# Patient Record
Sex: Female | Born: 1945 | Race: Black or African American | Hispanic: No | Marital: Married | Smoking: Never smoker
Health system: Southern US, Community
[De-identification: ages and names within clinical notes are randomized; demographics above are authoritative.]

## PROBLEM LIST (undated history)

## (undated) DIAGNOSIS — R42 Dizziness and giddiness: Secondary | ICD-10-CM

## (undated) DIAGNOSIS — Z8719 Personal history of other diseases of the digestive system: Secondary | ICD-10-CM

## (undated) DIAGNOSIS — G4730 Sleep apnea, unspecified: Secondary | ICD-10-CM

## (undated) DIAGNOSIS — I10 Essential (primary) hypertension: Secondary | ICD-10-CM

## (undated) DIAGNOSIS — K219 Gastro-esophageal reflux disease without esophagitis: Secondary | ICD-10-CM

## (undated) DIAGNOSIS — G43909 Migraine, unspecified, not intractable, without status migrainosus: Secondary | ICD-10-CM

## (undated) HISTORY — PX: ABDOMINAL HYSTERECTOMY: SHX81

## (undated) HISTORY — PX: BREAST EXCISIONAL BIOPSY: SUR124

---

## 2005-07-09 ENCOUNTER — Emergency Department: Payer: Self-pay | Admitting: Emergency Medicine

## 2005-07-09 ENCOUNTER — Other Ambulatory Visit: Payer: Self-pay

## 2006-07-29 ENCOUNTER — Encounter: Admission: RE | Admit: 2006-07-29 | Discharge: 2006-07-29 | Payer: Self-pay | Admitting: Internal Medicine

## 2007-08-25 ENCOUNTER — Encounter: Admission: RE | Admit: 2007-08-25 | Discharge: 2007-08-25 | Payer: Self-pay | Admitting: Internal Medicine

## 2007-09-07 ENCOUNTER — Other Ambulatory Visit: Admission: RE | Admit: 2007-09-07 | Discharge: 2007-09-07 | Payer: Self-pay | Admitting: Internal Medicine

## 2007-09-07 ENCOUNTER — Encounter: Payer: Self-pay | Admitting: Internal Medicine

## 2007-09-07 ENCOUNTER — Ambulatory Visit: Payer: Self-pay | Admitting: Internal Medicine

## 2007-09-07 DIAGNOSIS — F3289 Other specified depressive episodes: Secondary | ICD-10-CM | POA: Insufficient documentation

## 2007-09-07 DIAGNOSIS — E785 Hyperlipidemia, unspecified: Secondary | ICD-10-CM | POA: Insufficient documentation

## 2007-09-07 DIAGNOSIS — J309 Allergic rhinitis, unspecified: Secondary | ICD-10-CM | POA: Insufficient documentation

## 2007-09-07 DIAGNOSIS — E0590 Thyrotoxicosis, unspecified without thyrotoxic crisis or storm: Secondary | ICD-10-CM | POA: Insufficient documentation

## 2007-09-07 DIAGNOSIS — F329 Major depressive disorder, single episode, unspecified: Secondary | ICD-10-CM | POA: Insufficient documentation

## 2007-09-07 DIAGNOSIS — I10 Essential (primary) hypertension: Secondary | ICD-10-CM | POA: Insufficient documentation

## 2007-09-07 LAB — CONVERTED CEMR LAB
ALT: 18 units/L (ref 0–35)
AST: 16 units/L (ref 0–37)
Albumin: 3.9 g/dL (ref 3.5–5.2)
Alkaline Phosphatase: 76 units/L (ref 39–117)
BUN: 14 mg/dL (ref 6–23)
Basophils Absolute: 0 10*3/uL (ref 0.0–0.1)
Basophils Relative: 0.6 % (ref 0.0–1.0)
Bilirubin, Direct: 0.1 mg/dL (ref 0.0–0.3)
CO2: 30 meq/L (ref 19–32)
Calcium: 9.1 mg/dL (ref 8.4–10.5)
Chloride: 103 meq/L (ref 96–112)
Cholesterol: 232 mg/dL (ref 0–200)
Creatinine, Ser: 0.8 mg/dL (ref 0.4–1.2)
Direct LDL: 155.5 mg/dL
Eosinophils Absolute: 0.6 10*3/uL (ref 0.0–0.7)
Eosinophils Relative: 9.4 % — ABNORMAL HIGH (ref 0.0–5.0)
GFR calc Af Amer: 94 mL/min
GFR calc non Af Amer: 78 mL/min
Glucose, Bld: 83 mg/dL (ref 70–99)
HCT: 40.3 % (ref 36.0–46.0)
HDL: 41 mg/dL (ref >39.0)
Hemoglobin: 13.9 g/dL (ref 12.0–15.0)
Lymphocytes Relative: 40.9 % (ref 12.0–46.0)
MCHC: 34.4 g/dL (ref 30.0–36.0)
MCV: 84.2 fL (ref 78.0–100.0)
Monocytes Absolute: 0.4 10*3/uL (ref 0.1–1.0)
Monocytes Relative: 5.4 % (ref 3.0–12.0)
Neutro Abs: 3 10*3/uL (ref 1.4–7.7)
Neutrophils Relative %: 43.7 % (ref 43.0–77.0)
Platelets: 183 10*3/uL (ref 150–400)
Potassium: 3.5 meq/L (ref 3.5–5.1)
RBC: 4.79 M/uL (ref 3.87–5.11)
RDW: 12.5 % (ref 11.5–14.6)
Sodium: 143 meq/L (ref 135–145)
TSH: 0.98 microintl units/mL (ref 0.35–5.50)
Total Bilirubin: 0.6 mg/dL (ref 0.3–1.2)
Total CHOL/HDL Ratio: 5.7
Total Protein: 7.3 g/dL (ref 6.0–8.3)
Triglycerides: 151 mg/dL — ABNORMAL HIGH (ref 0–149)
VLDL: 30 mg/dL (ref 0–40)
WBC: 6.7 10*3/uL (ref 4.5–10.5)

## 2007-11-24 ENCOUNTER — Ambulatory Visit: Payer: Self-pay | Admitting: Internal Medicine

## 2008-02-23 ENCOUNTER — Telehealth: Payer: Self-pay | Admitting: Internal Medicine

## 2008-12-01 ENCOUNTER — Encounter: Admission: RE | Admit: 2008-12-01 | Discharge: 2008-12-01 | Payer: Self-pay | Admitting: Internal Medicine

## 2009-06-08 ENCOUNTER — Telehealth: Payer: Self-pay | Admitting: Internal Medicine

## 2010-01-12 ENCOUNTER — Encounter: Admission: RE | Admit: 2010-01-12 | Discharge: 2010-01-12 | Payer: Self-pay | Admitting: Internal Medicine

## 2010-04-08 ENCOUNTER — Encounter: Payer: Self-pay | Admitting: Endocrinology

## 2010-04-08 ENCOUNTER — Encounter: Payer: Self-pay | Admitting: Internal Medicine

## 2010-04-17 NOTE — Progress Notes (Signed)
Summary: lisinopril/hctz refill  Phone Note Refill Request Message from:  Fax from Pharmacy  Refills Requested: Medication #1:  LISINOPRIL-HYDROCHLOROTHIAZIDE 20-12.5 MG  TABS 1 once daily [facility] [phone]   [phone]  Initial call taken by: Warnell Forester,  June 08, 2009 11:02 AM    Prescriptions: LISINOPRIL-HYDROCHLOROTHIAZIDE 20-12.5 MG  TABS (LISINOPRIL-HYDROCHLOROTHIAZIDE) 1 once daily  #30 x 1   Entered by:   Duard Brady LPN   Authorized by:   Gordy Savers  MD   Signed by:   Duard Brady LPN on 82/95/6213   Method used:   Faxed to ...       [facility] (retail)       [address]       [place], [place]  [address]       Ph: [phone]       Fax: [phone]   RxID:   [id number]  30 day rx only - last seen 11/2007 - will need to be seen. KIK

## 2011-03-18 ENCOUNTER — Other Ambulatory Visit: Payer: Self-pay | Admitting: Internal Medicine

## 2011-03-18 DIAGNOSIS — Z1231 Encounter for screening mammogram for malignant neoplasm of breast: Secondary | ICD-10-CM

## 2011-03-21 ENCOUNTER — Ambulatory Visit
Admission: RE | Admit: 2011-03-21 | Discharge: 2011-03-21 | Disposition: A | Discharge status: Home / Self Care | Payer: BC Managed Care – PPO | Source: Ambulatory Visit | Attending: Internal Medicine | Admitting: Internal Medicine

## 2011-03-21 DIAGNOSIS — Z1231 Encounter for screening mammogram for malignant neoplasm of breast: Secondary | ICD-10-CM

## 2011-04-02 ENCOUNTER — Encounter: Payer: Self-pay | Admitting: Internal Medicine

## 2012-05-21 ENCOUNTER — Other Ambulatory Visit: Payer: Self-pay

## 2012-05-21 DIAGNOSIS — Z1231 Encounter for screening mammogram for malignant neoplasm of breast: Secondary | ICD-10-CM

## 2012-06-17 ENCOUNTER — Ambulatory Visit
Admission: RE | Admit: 2012-06-17 | Discharge: 2012-06-17 | Disposition: A | Discharge status: Home / Self Care | Payer: BC Managed Care – PPO | Source: Ambulatory Visit

## 2012-06-17 DIAGNOSIS — Z1231 Encounter for screening mammogram for malignant neoplasm of breast: Secondary | ICD-10-CM

## 2012-07-29 ENCOUNTER — Other Ambulatory Visit: Payer: Self-pay | Admitting: Family Medicine

## 2012-07-29 ENCOUNTER — Other Ambulatory Visit (HOSPITAL_COMMUNITY)
Admission: RE | Admit: 2012-07-29 | Discharge: 2012-07-29 | Disposition: A | Discharge status: Home / Self Care | Payer: BC Managed Care – PPO | Source: Ambulatory Visit | Attending: Family Medicine | Admitting: Family Medicine

## 2012-07-29 DIAGNOSIS — Z124 Encounter for screening for malignant neoplasm of cervix: Secondary | ICD-10-CM | POA: Insufficient documentation

## 2013-06-23 DIAGNOSIS — Z1211 Encounter for screening for malignant neoplasm of colon: Secondary | ICD-10-CM | POA: Diagnosis not present

## 2013-08-10 ENCOUNTER — Other Ambulatory Visit: Payer: Self-pay

## 2013-08-10 DIAGNOSIS — Z1231 Encounter for screening mammogram for malignant neoplasm of breast: Secondary | ICD-10-CM

## 2013-08-13 ENCOUNTER — Encounter (INDEPENDENT_AMBULATORY_CARE_PROVIDER_SITE_OTHER): Payer: Self-pay

## 2013-08-13 ENCOUNTER — Ambulatory Visit
Admission: RE | Admit: 2013-08-13 | Discharge: 2013-08-13 | Disposition: A | Discharge status: Home / Self Care | Payer: BC Managed Care – PPO | Source: Ambulatory Visit

## 2013-08-13 DIAGNOSIS — Z1231 Encounter for screening mammogram for malignant neoplasm of breast: Secondary | ICD-10-CM

## 2014-04-07 DIAGNOSIS — Z681 Body mass index (BMI) 19 or less, adult: Secondary | ICD-10-CM | POA: Diagnosis not present

## 2014-04-07 DIAGNOSIS — L820 Inflamed seborrheic keratosis: Secondary | ICD-10-CM | POA: Diagnosis not present

## 2014-04-07 DIAGNOSIS — L821 Other seborrheic keratosis: Secondary | ICD-10-CM | POA: Diagnosis not present

## 2014-08-01 DIAGNOSIS — K099 Cyst of oral region, unspecified: Secondary | ICD-10-CM | POA: Diagnosis not present

## 2014-08-09 ENCOUNTER — Other Ambulatory Visit: Payer: Self-pay

## 2014-08-09 DIAGNOSIS — Z1231 Encounter for screening mammogram for malignant neoplasm of breast: Secondary | ICD-10-CM

## 2014-09-05 ENCOUNTER — Ambulatory Visit
Admission: RE | Admit: 2014-09-05 | Discharge: 2014-09-05 | Disposition: A | Discharge status: Home / Self Care | Payer: Medicare Other | Source: Ambulatory Visit

## 2014-09-05 DIAGNOSIS — Z1231 Encounter for screening mammogram for malignant neoplasm of breast: Secondary | ICD-10-CM

## 2014-09-07 DIAGNOSIS — B078 Other viral warts: Secondary | ICD-10-CM | POA: Diagnosis not present

## 2014-11-07 DIAGNOSIS — E785 Hyperlipidemia, unspecified: Secondary | ICD-10-CM | POA: Diagnosis not present

## 2014-11-07 DIAGNOSIS — I10 Essential (primary) hypertension: Secondary | ICD-10-CM | POA: Diagnosis not present

## 2014-11-07 DIAGNOSIS — Z23 Encounter for immunization: Secondary | ICD-10-CM | POA: Diagnosis not present

## 2014-11-07 DIAGNOSIS — Z7189 Other specified counseling: Secondary | ICD-10-CM | POA: Diagnosis not present

## 2015-01-10 DIAGNOSIS — F411 Generalized anxiety disorder: Secondary | ICD-10-CM | POA: Diagnosis not present

## 2015-01-10 DIAGNOSIS — I10 Essential (primary) hypertension: Secondary | ICD-10-CM | POA: Diagnosis not present

## 2015-01-10 DIAGNOSIS — Z23 Encounter for immunization: Secondary | ICD-10-CM | POA: Diagnosis not present

## 2015-01-10 DIAGNOSIS — Z0000 Encounter for general adult medical examination without abnormal findings: Secondary | ICD-10-CM | POA: Diagnosis not present

## 2015-01-10 DIAGNOSIS — E785 Hyperlipidemia, unspecified: Secondary | ICD-10-CM | POA: Diagnosis not present

## 2015-02-20 DIAGNOSIS — Z780 Asymptomatic menopausal state: Secondary | ICD-10-CM | POA: Diagnosis not present

## 2015-02-20 DIAGNOSIS — M8589 Other specified disorders of bone density and structure, multiple sites: Secondary | ICD-10-CM | POA: Diagnosis not present

## 2015-07-05 DIAGNOSIS — M25562 Pain in left knee: Secondary | ICD-10-CM | POA: Diagnosis not present

## 2015-07-05 DIAGNOSIS — R350 Frequency of micturition: Secondary | ICD-10-CM | POA: Diagnosis not present

## 2015-07-05 DIAGNOSIS — I10 Essential (primary) hypertension: Secondary | ICD-10-CM | POA: Diagnosis not present

## 2015-07-05 DIAGNOSIS — M25561 Pain in right knee: Secondary | ICD-10-CM | POA: Diagnosis not present

## 2015-08-02 DIAGNOSIS — M8580 Other specified disorders of bone density and structure, unspecified site: Secondary | ICD-10-CM | POA: Diagnosis not present

## 2015-08-02 DIAGNOSIS — I10 Essential (primary) hypertension: Secondary | ICD-10-CM | POA: Diagnosis not present

## 2015-08-02 DIAGNOSIS — Z1159 Encounter for screening for other viral diseases: Secondary | ICD-10-CM | POA: Diagnosis not present

## 2015-08-02 DIAGNOSIS — E785 Hyperlipidemia, unspecified: Secondary | ICD-10-CM | POA: Diagnosis not present

## 2015-08-02 DIAGNOSIS — F411 Generalized anxiety disorder: Secondary | ICD-10-CM | POA: Diagnosis not present

## 2015-10-19 ENCOUNTER — Other Ambulatory Visit: Payer: Self-pay | Admitting: Family Medicine

## 2015-10-19 ENCOUNTER — Other Ambulatory Visit (HOSPITAL_COMMUNITY): Payer: Self-pay | Admitting: Diagnostic Radiology

## 2015-10-19 DIAGNOSIS — Z1231 Encounter for screening mammogram for malignant neoplasm of breast: Secondary | ICD-10-CM

## 2015-10-26 ENCOUNTER — Ambulatory Visit
Admission: RE | Admit: 2015-10-26 | Discharge: 2015-10-26 | Disposition: A | Discharge status: Home / Self Care | Payer: BC Managed Care – PPO | Source: Ambulatory Visit | Attending: Family Medicine | Admitting: Family Medicine

## 2015-10-26 DIAGNOSIS — Z1231 Encounter for screening mammogram for malignant neoplasm of breast: Secondary | ICD-10-CM | POA: Diagnosis not present

## 2016-01-17 DIAGNOSIS — F411 Generalized anxiety disorder: Secondary | ICD-10-CM | POA: Diagnosis not present

## 2016-01-17 DIAGNOSIS — Z0000 Encounter for general adult medical examination without abnormal findings: Secondary | ICD-10-CM | POA: Diagnosis not present

## 2016-01-17 DIAGNOSIS — Z1389 Encounter for screening for other disorder: Secondary | ICD-10-CM | POA: Diagnosis not present

## 2016-01-17 DIAGNOSIS — I10 Essential (primary) hypertension: Secondary | ICD-10-CM | POA: Diagnosis not present

## 2016-01-17 DIAGNOSIS — E785 Hyperlipidemia, unspecified: Secondary | ICD-10-CM | POA: Diagnosis not present

## 2016-01-17 DIAGNOSIS — L821 Other seborrheic keratosis: Secondary | ICD-10-CM | POA: Diagnosis not present

## 2016-01-17 DIAGNOSIS — Z23 Encounter for immunization: Secondary | ICD-10-CM | POA: Diagnosis not present

## 2016-01-17 DIAGNOSIS — M8580 Other specified disorders of bone density and structure, unspecified site: Secondary | ICD-10-CM | POA: Diagnosis not present

## 2016-12-03 ENCOUNTER — Other Ambulatory Visit: Payer: Self-pay | Admitting: Family Medicine

## 2016-12-03 DIAGNOSIS — Z1231 Encounter for screening mammogram for malignant neoplasm of breast: Secondary | ICD-10-CM

## 2016-12-05 ENCOUNTER — Ambulatory Visit
Admission: RE | Admit: 2016-12-05 | Discharge: 2016-12-05 | Disposition: A | Discharge status: Home / Self Care | Payer: Medicare Other | Source: Ambulatory Visit | Attending: Family Medicine | Admitting: Family Medicine

## 2016-12-05 DIAGNOSIS — Z1231 Encounter for screening mammogram for malignant neoplasm of breast: Secondary | ICD-10-CM

## 2017-05-15 ENCOUNTER — Other Ambulatory Visit: Payer: Self-pay | Admitting: Family Medicine

## 2017-05-15 DIAGNOSIS — R1310 Dysphagia, unspecified: Secondary | ICD-10-CM

## 2017-05-19 ENCOUNTER — Ambulatory Visit
Admission: RE | Admit: 2017-05-19 | Discharge: 2017-05-19 | Disposition: A | Discharge status: Home / Self Care | Payer: Medicare Other | Source: Ambulatory Visit | Attending: Family Medicine | Admitting: Family Medicine

## 2017-05-19 DIAGNOSIS — R1310 Dysphagia, unspecified: Secondary | ICD-10-CM

## 2017-11-11 ENCOUNTER — Other Ambulatory Visit: Payer: Self-pay | Admitting: Family Medicine

## 2017-11-11 DIAGNOSIS — Z1231 Encounter for screening mammogram for malignant neoplasm of breast: Secondary | ICD-10-CM

## 2017-12-10 ENCOUNTER — Ambulatory Visit
Admission: RE | Admit: 2017-12-10 | Discharge: 2017-12-10 | Disposition: A | Discharge status: Home / Self Care | Payer: Medicare Other | Source: Ambulatory Visit | Attending: Family Medicine | Admitting: Family Medicine

## 2017-12-10 DIAGNOSIS — Z1231 Encounter for screening mammogram for malignant neoplasm of breast: Secondary | ICD-10-CM

## 2018-01-22 ENCOUNTER — Other Ambulatory Visit: Payer: Self-pay | Admitting: Family Medicine

## 2018-01-22 DIAGNOSIS — E2839 Other primary ovarian failure: Secondary | ICD-10-CM

## 2018-01-22 DIAGNOSIS — M8580 Other specified disorders of bone density and structure, unspecified site: Secondary | ICD-10-CM

## 2018-02-06 ENCOUNTER — Ambulatory Visit
Admission: RE | Admit: 2018-02-06 | Discharge: 2018-02-06 | Disposition: A | Discharge status: Home / Self Care | Payer: Medicare Other | Source: Ambulatory Visit | Attending: Family Medicine | Admitting: Family Medicine

## 2018-02-06 DIAGNOSIS — M8580 Other specified disorders of bone density and structure, unspecified site: Secondary | ICD-10-CM

## 2018-02-06 DIAGNOSIS — E2839 Other primary ovarian failure: Secondary | ICD-10-CM

## 2018-02-09 ENCOUNTER — Other Ambulatory Visit: Payer: Medicare Other

## 2018-03-02 ENCOUNTER — Ambulatory Visit (INDEPENDENT_AMBULATORY_CARE_PROVIDER_SITE_OTHER): Payer: Medicare Other | Admitting: Urology

## 2018-03-02 ENCOUNTER — Encounter: Payer: Self-pay | Admitting: Urology

## 2018-03-02 VITALS — BP 130/76 | HR 84 | Ht 63.5 in | Wt 155.0 lb

## 2018-03-02 DIAGNOSIS — R350 Frequency of micturition: Secondary | ICD-10-CM | POA: Diagnosis not present

## 2018-03-02 LAB — URINALYSIS, COMPLETE
Bilirubin, UA: NEGATIVE
Glucose, UA: NEGATIVE
Ketones, UA: NEGATIVE
Leukocytes, UA: NEGATIVE
Nitrite, UA: NEGATIVE
Protein, UA: NEGATIVE
RBC, UA: NEGATIVE
Specific Gravity, UA: 1.02 (ref 1.005–1.030)
Urobilinogen, Ur: 0.2 mg/dL (ref 0.2–1.0)
pH, UA: 6.5 (ref 5.0–7.5)

## 2018-03-02 LAB — MICROSCOPIC EXAMINATION
Epithelial Cells (non renal): NONE SEEN /hpf (ref 0–10)
WBC, UA: NONE SEEN /hpf (ref 0–5)

## 2018-03-02 LAB — BLADDER SCAN AMB NON-IMAGING: Scan Result: 19

## 2018-03-02 MED ORDER — MIRABEGRON ER 50 MG PO TB24: 50.0000 mg | ORAL_TABLET | Freq: Every day | ORAL | 11 refills | Status: DC

## 2018-03-02 NOTE — Progress Notes (Signed)
03/02/2018 1:32 PM   Monique Thompson 06/25/1945 [id number]  Referring provider: Deatra JamesSun, Vyvyan, MD [phone] [address] [place], [place] [id number]  Chief Complaint  Patient presents with  . frequency of micturition    HPI: Was consulted to assess the patient's voiding dysfunction primarily at night.  She gets up 4 times a night.  She will void a small volume.  Will stop and start.  She may have a little bit urgency or uncomfortable feeling.  During the day she voids every 2 or 3 hours.  She rarely leaks with a full bladder with a sneeze.  Sometimes her flow was good other times not so and it may be volume dependent  She has had a hysterectomy.  She denies a history of kidney stones previous to surgery and bladder infections  Modifying factors: There are no other modifying factors  Associated signs and symptoms: There are no other associated signs and symptoms Aggravating and relieving factors: There are no other aggravating or relieving factors Severity: Moderate Duration: Persistent   PMH: History reviewed. No pertinent past medical history.  Surgical History: Past Surgical History:  Procedure Laterality Date  . BREAST EXCISIONAL BIOPSY Left   . BREAST EXCISIONAL BIOPSY Left   . BREAST EXCISIONAL BIOPSY Right     Home Medications:  Allergies as of 03/02/2018      Reactions   Latex Rash      Medication List       Accurate as of March 02, 2018  1:32 PM. Always use your most recent med list.        amLODipine 5 MG tablet Commonly known as:  NORVASC   FLUoxetine 40 MG capsule Commonly known as:  PROZAC Take 40 mg by mouth daily.   latanoprost 0.005 % ophthalmic solution Commonly known as:  XALATAN 1 drop at bedtime.   lisinopril 10 MG tablet Commonly known as:  PRINIVIL,ZESTRIL Take by mouth.       Allergies:  Allergies  Allergen Reactions  . Latex Rash    Family History: Family History  Problem Relation Age of Onset  . Breast  cancer Neg Hx     Social History:  reports that she has never smoked. She has never used smokeless tobacco. She reports current alcohol use. She reports that she does not use drugs.  ROS: UROLOGY Frequent Urination?: Yes Hard to postpone urination?: No Burning/pain with urination?: No Get up at night to urinate?: Yes Leakage of urine?: No Urine stream starts and stops?: Yes Trouble starting stream?: Yes Do you have to strain to urinate?: No Blood in urine?: No Urinary tract infection?: No Sexually transmitted disease?: No Injury to kidneys or bladder?: No Painful intercourse?: No Weak stream?: No Currently pregnant?: No Vaginal bleeding?: No Last menstrual period?: n  Gastrointestinal Nausea?: No Vomiting?: No Indigestion/heartburn?: Yes Diarrhea?: No Constipation?: No  Constitutional Fever: No Night sweats?: No Weight loss?: No Fatigue?: No  Skin Skin rash/lesions?: No Itching?: No  Eyes Blurred vision?: No Double vision?: No  Ears/Nose/Throat Sore throat?: No Sinus problems?: Yes  Hematologic/Lymphatic Swollen glands?: No Easy bruising?: Yes  Cardiovascular Leg swelling?: Yes Chest pain?: No  Respiratory Cough?: No Shortness of breath?: No  Endocrine Excessive thirst?: No  Musculoskeletal Back pain?: No Joint pain?: No  Neurological Headaches?: No Dizziness?: No  Psychologic Depression?: No Anxiety?: Yes  Physical Exam: BP 130/76 (BP Location: Left Arm, Patient Position: Sitting, Cuff Size: Normal)   Pulse 84   Ht 5' 3.5" (1.613  m)   Wt 155 lb (70.3 kg)   BMI 27.03 kg/m   Constitutional:  Alert and oriented, No acute distress. HEENT:  AT, moist mucus membranes.  Trachea midline, no masses. Cardiovascular: No clubbing, cyanosis, or edema. Respiratory: Normal respiratory effort, no increased work of breathing. GI: Abdomen is soft, nontender, nondistended, no abdominal masses GU: No CVA tenderness.  No bladder tenderness Skin:  No rashes, bruises or suspicious lesions. Lymph: No cervical or inguinal adenopathy. Neurologic: Grossly intact, no focal deficits, moving all 4 extremities. Psychiatric: Normal mood and affect.  Laboratory Data: Lab Results  Component Value Date   WBC 6.7 09/07/2007   HGB 13.9 09/07/2007   HCT 40.3 09/07/2007   MCV 84.2 09/07/2007   PLT 183 09/07/2007    Lab Results  Component Value Date   CREATININE 0.8 09/07/2007    No results found for: PSA  No results found for: TESTOSTERONE  No results found for: HGBA1C  Urinalysis No results found for: COLORURINE, APPEARANCEUR, LABSPEC, PHURINE, GLUCOSEU, HGBUR, BILIRUBINUR, KETONESUR, PROTEINUR, UROBILINOGEN, NITRITE, LEUKOCYTESUR  Pertinent Imaging:   Assessment & Plan: The patient's symptoms may be volume dependent.  She emptied well today. The more I spoke to the patient I do feel she is feeling a little bit of urgency in the suprapubic area even during the day.  She has never smoked.  I gave her Myrbetriq samples and prescription 50 mg and I will perform cystoscopy in 6 weeks.  She did have microscopic hematuria today but her urine looked like it could be infected.  This is a persistent finding after cystoscopy I would order a CT scan  1. Frequency of micturition  - Urinalysis, Complete - Bladder Scan (Post Void Residual) in office   No follow-ups on file.  Martina Sinner, MD  [facility] [address] [place], [place] [address] [phone]

## 2018-03-02 NOTE — Addendum Note (Signed)
Addended by: Honor LohGARRISON, Kennard Fildes M on: 03/02/2018 01:43 PM   Modules accepted: Orders

## 2018-03-05 LAB — CULTURE, URINE COMPREHENSIVE

## 2018-04-13 ENCOUNTER — Encounter: Payer: Self-pay | Admitting: Urology

## 2018-04-13 ENCOUNTER — Ambulatory Visit (INDEPENDENT_AMBULATORY_CARE_PROVIDER_SITE_OTHER): Payer: Medicare Other | Admitting: Urology

## 2018-04-13 VITALS — BP 136/86 | HR 80 | Ht 63.5 in | Wt 155.4 lb

## 2018-04-13 DIAGNOSIS — R351 Nocturia: Secondary | ICD-10-CM | POA: Diagnosis not present

## 2018-04-13 DIAGNOSIS — R350 Frequency of micturition: Secondary | ICD-10-CM

## 2018-04-13 LAB — URINALYSIS, COMPLETE
Bilirubin, UA: NEGATIVE
Glucose, UA: NEGATIVE
Ketones, UA: NEGATIVE
Leukocytes, UA: NEGATIVE
Nitrite, UA: NEGATIVE
RBC, UA: NEGATIVE
Specific Gravity, UA: 1.02 (ref 1.005–1.030)
Urobilinogen, Ur: 1 mg/dL (ref 0.2–1.0)
pH, UA: 7 (ref 5.0–7.5)

## 2018-04-13 LAB — MICROSCOPIC EXAMINATION: Bacteria, UA: NONE SEEN

## 2018-04-13 NOTE — Progress Notes (Signed)
04/13/2018 10:14 AM   Monique Thompson 1946-03-15 [id number]  Referring provider: Deatra James, MD [phone] [address] [place], [place] [address]  Chief Complaint  Patient presents with  . Cysto    HPI: Was consulted to assess the patient's voiding dysfunction primarily at night.  She gets up 4 times a night.  She will void a small volume.  Will stop and start.  She may have a little bit urgency or uncomfortable feeling.  During the day she voids every 2 or 3 hours.  She rarely leaks with a full bladder with a sneeze.  Sometimes her flow was good other times not so and it may be volume dependent  The patient's symptoms may be volume dependent.  She emptied well today. The more I spoke to the patient I do feel she is feeling a little bit of urgency in the suprapubic area even during the day.  She has never smoked.  I gave her Myrbetriq samples and prescription 50 mg and I will perform cystoscopy in 6 weeks.  She did have microscopic hematuria today but her urine looked like it could be infected.  This is a persistent finding after cystoscopy I would order a CT scan  Today Frequency stable.  Last urine culture negative. Nocturia stable.  Clinically not infected Cystoscopy: Patient underwent flexible cystoscopy utilizing sterile technique after consent.  Bladder mucosa and trigone were normal.  Efflux was normal.  No cystitis or carcinoma. Reasonably well supported bladder neck and no prolapse  PMH: No past medical history on file.  Surgical History: Past Surgical History:  Procedure Laterality Date  . BREAST EXCISIONAL BIOPSY Left   . BREAST EXCISIONAL BIOPSY Left   . BREAST EXCISIONAL BIOPSY Right     Home Medications:  Allergies as of 04/13/2018      Reactions   Latex Rash      Medication List       Accurate as of April 13, 2018 10:14 AM. Always use your most recent med list.        amLODipine 5 MG tablet Commonly known as:  NORVASC   FLUoxetine 40 MG  capsule Commonly known as:  PROZAC Take 40 mg by mouth daily.   latanoprost 0.005 % ophthalmic solution Commonly known as:  XALATAN 1 drop at bedtime.   lisinopril 10 MG tablet Commonly known as:  PRINIVIL,ZESTRIL Take by mouth.   mirabegron ER 50 MG Tb24 tablet Commonly known as:  MYRBETRIQ Take 1 tablet (50 mg total) by mouth daily.       Allergies:  Allergies  Allergen Reactions  . Latex Rash    Family History: Family History  Problem Relation Age of Onset  . Breast cancer Neg Hx     Social History:  reports that she has never smoked. She has never used smokeless tobacco. She reports current alcohol use. She reports that she does not use drugs.  ROS:                                        Physical Exam: There were no vitals taken for this visit.   Laboratory Data: Lab Results  Component Value Date   WBC 6.7 09/07/2007   HGB 13.9 09/07/2007   HCT 40.3 09/07/2007   MCV 84.2 09/07/2007   PLT 183 09/07/2007    Lab Results  Component Value Date   CREATININE 0.8  09/07/2007    No results found for: PSA  No results found for: TESTOSTERONE  No results found for: HGBA1C  Urinalysis    Component Value Date/Time   APPEARANCEUR Clear 03/02/2018 1326   GLUCOSEU Negative 03/02/2018 1326   BILIRUBINUR Negative 03/02/2018 1326   PROTEINUR Negative 03/02/2018 1326   NITRITE Negative 03/02/2018 1326   LEUKOCYTESUR Negative 03/02/2018 1326    Pertinent Imaging:   Assessment & Plan: Patient has nocturia of varying amounts.  Role of desmopressin discussed.  Potential co-pay issues discussed.  Pros cons risk discussed including black box warning  The patient was given low-dose desmopressin 2 weeks samples.  She will be called with blood tests results tomorrow.  Basic metabolic panel ordered.  She will see our nurse practitioner in 2 weeks.  If the medication is effective a prescription will be given and the serum sodium level will be  checked again.  If the patient is on long-term medication they should have another serum sodium in approximately 1 month after the 2-week level.  This can be a drop off.  We will call if the serum sodium levels are abnormal.  She understands that the medication may be expensive but wish to try it obviously if it is not working in 2 weeks we will not check another serum sodium  1. Frequency of micturition  - Urinalysis, Complete   No follow-ups on file.  Martina Sinner, MD  [facility] [address] [place], [place] [address] [phone]

## 2018-04-14 LAB — BASIC METABOLIC PANEL
BUN/Creatinine Ratio: 14 (ref 12–28)
BUN: 11 mg/dL (ref 8–27)
CO2: 25 mmol/L (ref 20–29)
Calcium: 9.7 mg/dL (ref 8.7–10.3)
Chloride: 101 mmol/L (ref 96–106)
Creatinine, Ser: 0.79 mg/dL (ref 0.57–1.00)
GFR calc Af Amer: 88 mL/min/{1.73_m2} (ref >59)
GFR calc non Af Amer: 76 mL/min/{1.73_m2} (ref >59)
Glucose: 82 mg/dL (ref 65–99)
Potassium: 4.3 mmol/L (ref 3.5–5.2)
Sodium: 145 mmol/L — ABNORMAL HIGH (ref 134–144)

## 2018-04-16 ENCOUNTER — Telehealth: Payer: Self-pay | Admitting: Urology

## 2018-04-16 NOTE — Telephone Encounter (Signed)
LMOM for patient to return call.

## 2018-04-16 NOTE — Telephone Encounter (Signed)
I spoke with patient and I saw the labs and gave the results. I did notice the UCX was not ordered. Her BMP is elevated by 1 point. Patient is wanting to know what she is to do about the medication. Please advise.

## 2018-04-16 NOTE — Telephone Encounter (Signed)
Pt called requesting a call back for lab results  

## 2018-04-17 NOTE — Telephone Encounter (Signed)
Okay to start the samples because her kidney function is good and serum sodium is within normal limits

## 2018-04-17 NOTE — Telephone Encounter (Signed)
Patient notified and voiced understanding. She will return in 7 days for a sodium check

## 2018-04-29 ENCOUNTER — Telehealth: Payer: Self-pay | Admitting: Urology

## 2018-04-29 ENCOUNTER — Ambulatory Visit: Payer: Medicare Other | Admitting: Urology

## 2018-04-29 NOTE — Telephone Encounter (Signed)
Please call Monique Thompson and have her reschedule her missed appointment with me today.  She had microscopic blood in her urine and will need further studies.

## 2018-05-26 ENCOUNTER — Telehealth: Payer: Self-pay | Admitting: Urology

## 2018-05-26 NOTE — Telephone Encounter (Signed)
2 no show letters have been sent to the patient, I also called pt and lm to cb to reschedule appt.

## 2018-05-26 NOTE — Telephone Encounter (Signed)
Please call Monique Thompson and have her reschedule her missed appointment with me today.  She had microscopic blood in her urine and will need further studies.

## 2018-06-22 ENCOUNTER — Encounter: Payer: Self-pay | Admitting: Urology

## 2018-06-22 NOTE — Progress Notes (Signed)
Certified letter sent 06/22/2018 

## 2018-09-20 IMAGING — RF DG ESOPHAGUS
6 series · 14 of 23 positions shown · non-contrast
Comparison: None.

CLINICAL DATA: Dysphagia

EXAM:
ESOPHOGRAM / BARIUM SWALLOW / BARIUM TABLET STUDY
TECHNIQUE: Combined double contrast and single contrast examination performed
using effervescent crystals, thick barium liquid, and thin barium
liquid. The patient was observed with fluoroscopy swallowing a 13 mm
barium sulphate tablet.
FLUOROSCOPY TIME:  Fluoroscopy Time:  1 minutes 24 seconds
Radiation Exposure Index (if provided by the fluoroscopic device):
96 mGy
Number of Acquired Spot Images: 0

[Series 1: sequence · 2 of 10 frames shown (1 of 5)]
[frame 2/10]
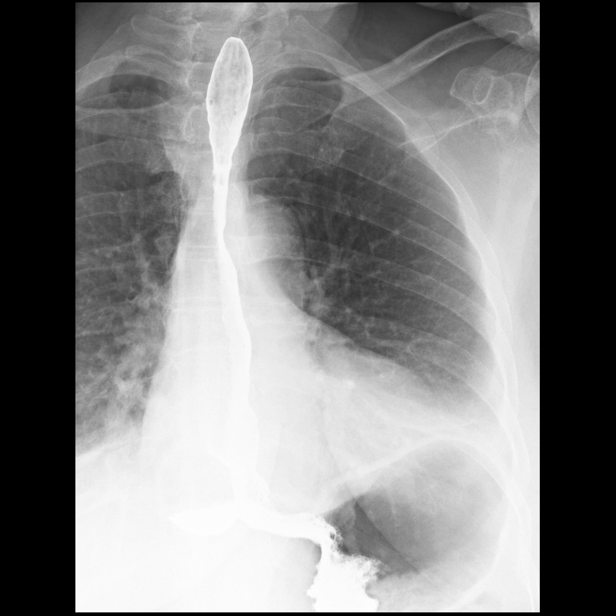
[frame 9/10]
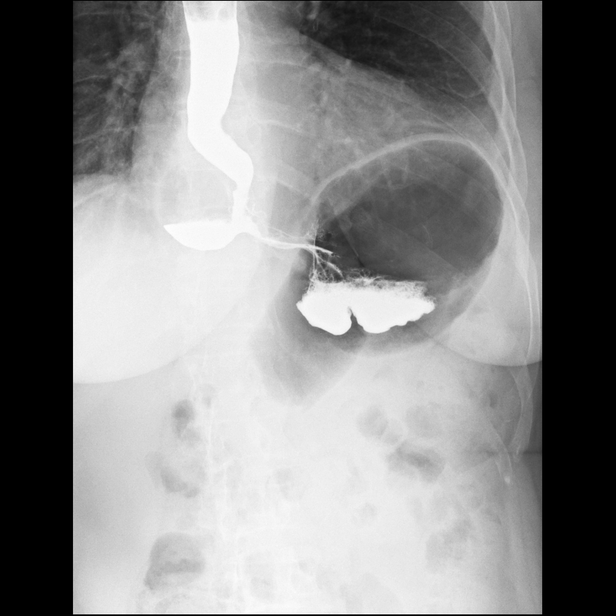

[Series 2: sequence · 2 of 3 frames shown (2 of 5)]
[frame 2/3]
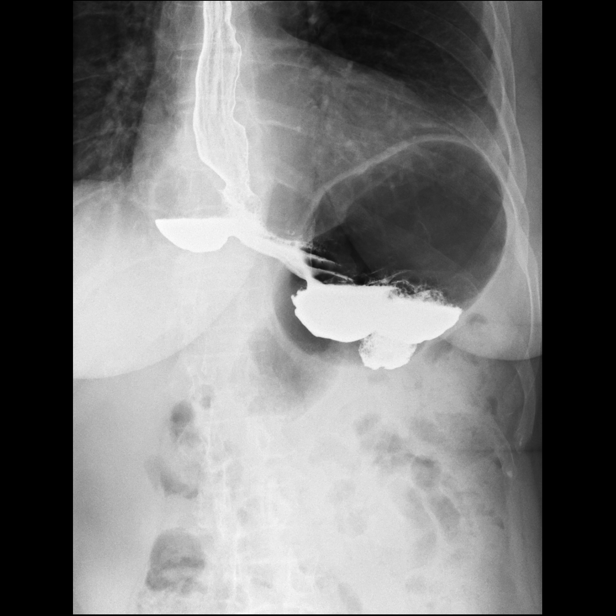
[frame 3/3]
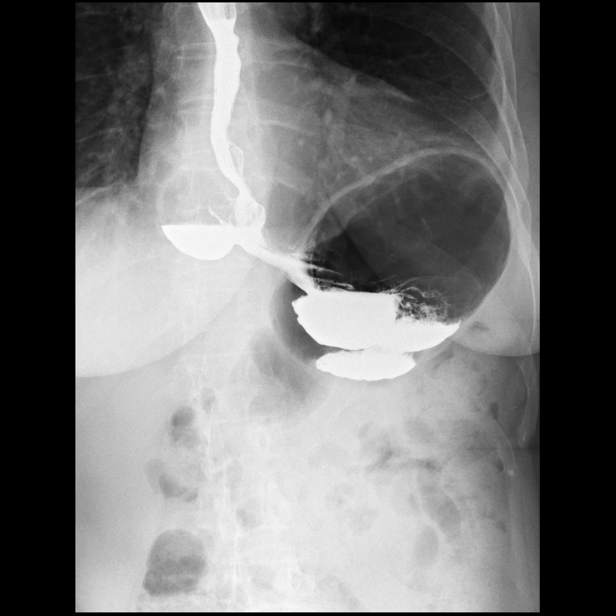

[Series 3: sequence · 1 of 23 frames shown (3 of 5)]
[frame 12/23]
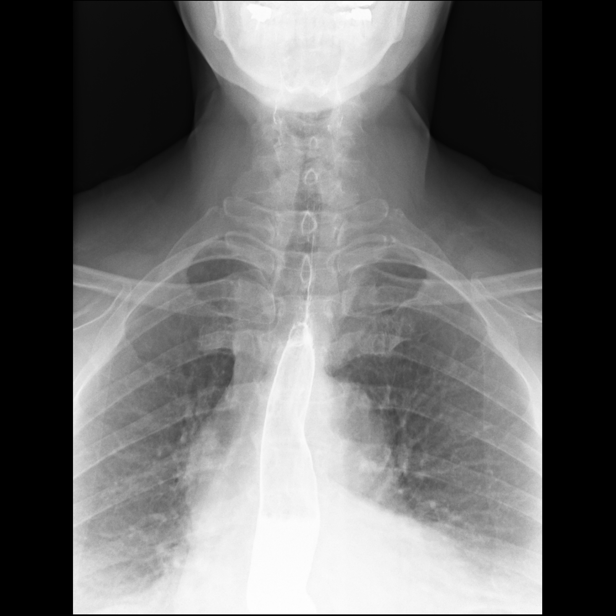

[Series 4: sequence · 3 of 17 frames shown (4 of 5)]
[frame 3/17]
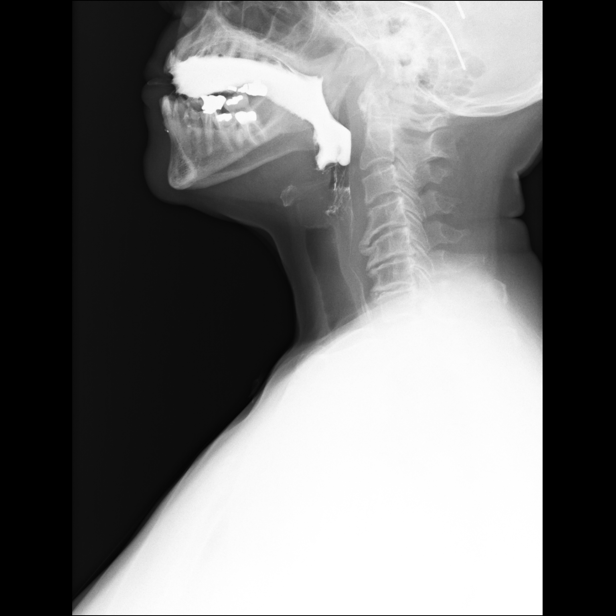
[frame 9/17]
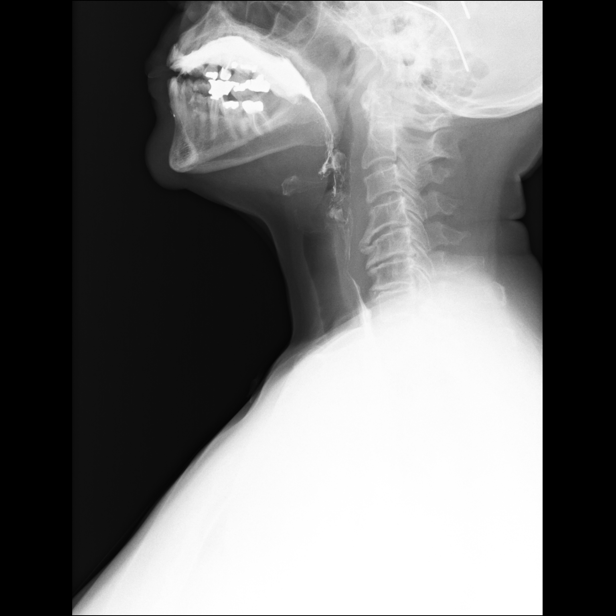
[frame 15/17]
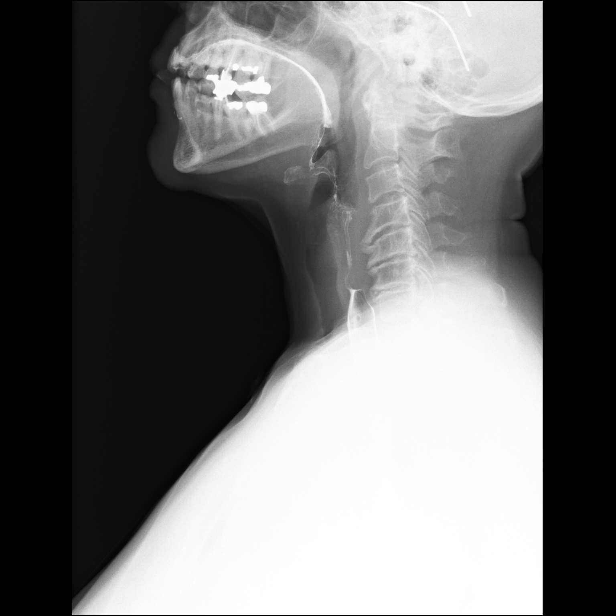

[Series 5: sequence · 2 of 32 frames shown (5 of 5)]
[frame 5/32]
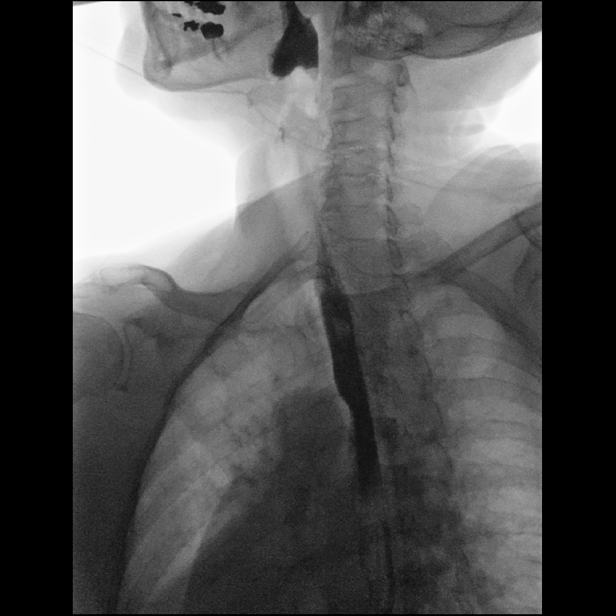
[frame 26/32]
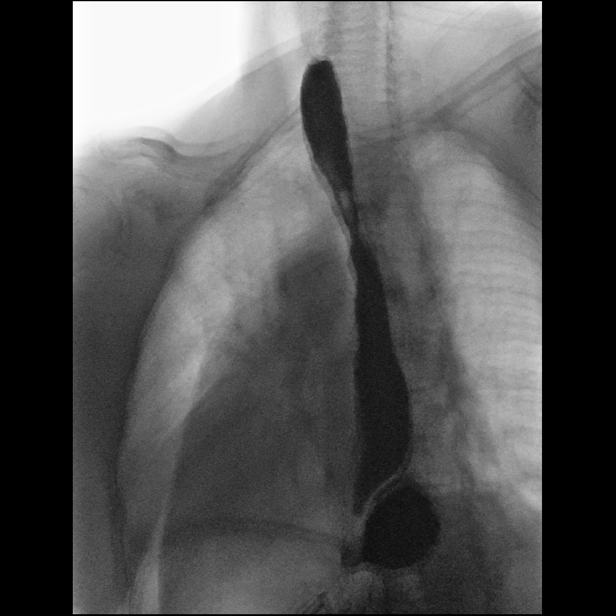

[Series 6: one shot · 4 of 6 slices shown]
[im 1/6]
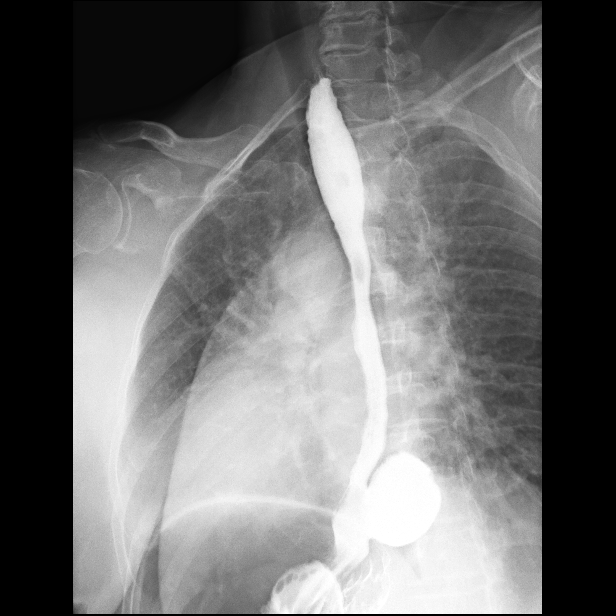
[im 2/6]
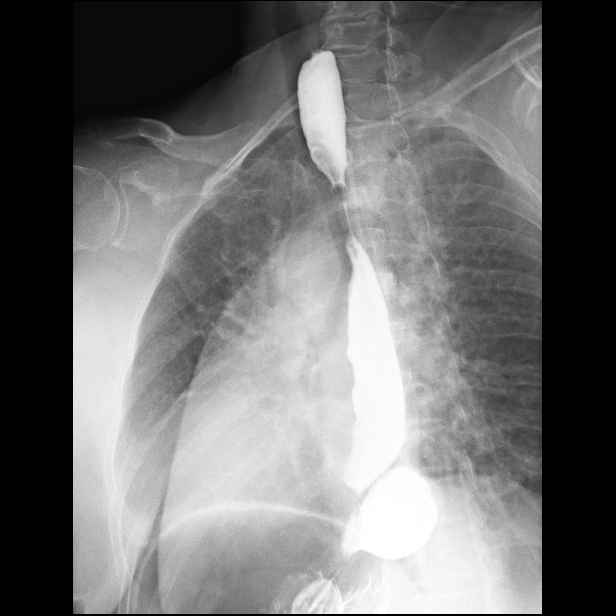
[im 4/6]
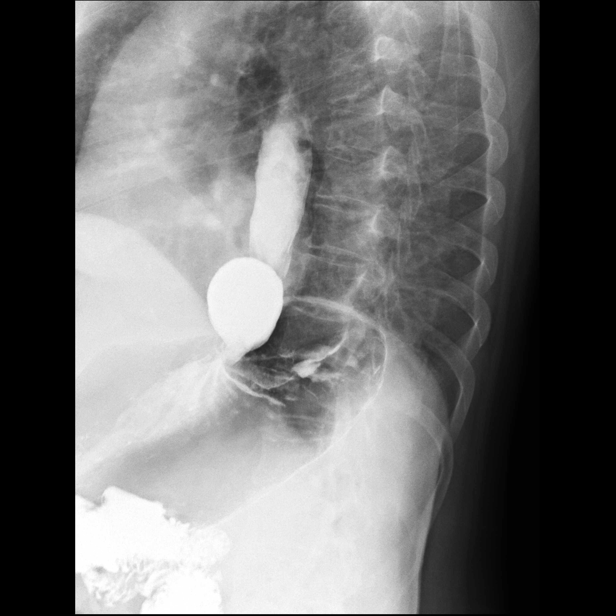
[im 6/6]
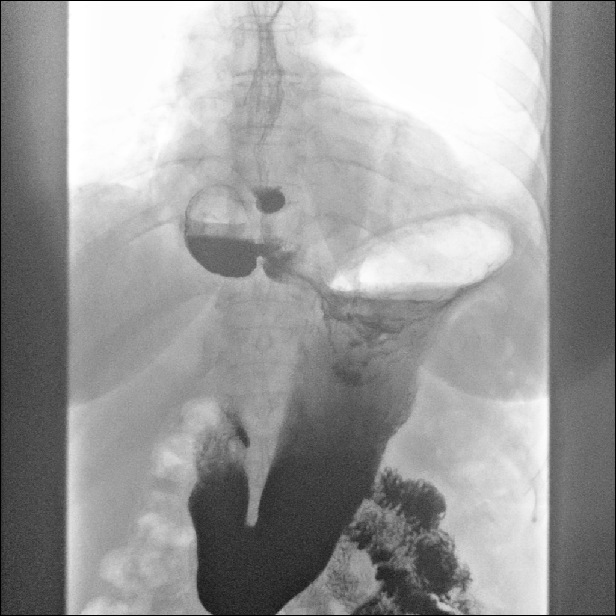

[14 of 23 positions shown; findings below may reference images not displayed]

FINDINGS: Initially double-contrast barium swallow was performed. The mucosa
of the esophagus is unremarkable. Rapid sequence spot films show the
swallowing mechanism to be normal. There is very slight indentation
upon the lower cervical esophagus primarily from the left which
could be due to prominent thyroid or thyroid nodule. Correlate
clinically. Otherwise esophageal peristalsis is normal. No
penetration or aspiration is seen. There is prominence of the
cricopharyngeus muscle noted. There is a small paraesophageal hiatal
hernia present. Moderate gastroesophageal reflux is demonstrated. A
barium pill was given at the end of the study which did lodge in the
distal esophagus consistent with a short segment distal esophageal
stricture
IMPRESSION: 1. Small paraesophageal hiatal hernia.
2. Barium pill lodges in the distal esophagus consistent with a
short segment distal esophageal stricture.
3. Moderate gastroesophageal reflux. Prominent cricopharyngeus
muscle.
4. Questionable indentation upon the lower cervical esophagus could
indicate prominence of the thyroid gland. Correlate clinically.

## 2018-11-17 ENCOUNTER — Other Ambulatory Visit: Payer: Self-pay | Admitting: Family Medicine

## 2018-11-17 DIAGNOSIS — Z1231 Encounter for screening mammogram for malignant neoplasm of breast: Secondary | ICD-10-CM

## 2018-11-18 ENCOUNTER — Other Ambulatory Visit: Payer: Self-pay | Admitting: Family Medicine

## 2018-11-18 DIAGNOSIS — N644 Mastodynia: Secondary | ICD-10-CM

## 2018-12-14 ENCOUNTER — Ambulatory Visit
Admission: RE | Admit: 2018-12-14 | Discharge: 2018-12-14 | Disposition: A | Discharge status: Home / Self Care | Payer: Medicare Other | Source: Ambulatory Visit | Attending: Family Medicine | Admitting: Family Medicine

## 2018-12-14 ENCOUNTER — Other Ambulatory Visit: Payer: Self-pay

## 2018-12-14 DIAGNOSIS — N644 Mastodynia: Secondary | ICD-10-CM

## 2019-08-11 ENCOUNTER — Other Ambulatory Visit: Payer: Self-pay | Admitting: Obstetrics and Gynecology

## 2019-08-11 DIAGNOSIS — M8580 Other specified disorders of bone density and structure, unspecified site: Secondary | ICD-10-CM

## 2019-09-29 DIAGNOSIS — H2513 Age-related nuclear cataract, bilateral: Secondary | ICD-10-CM | POA: Diagnosis not present

## 2019-11-25 DIAGNOSIS — L820 Inflamed seborrheic keratosis: Secondary | ICD-10-CM | POA: Diagnosis not present

## 2019-11-25 DIAGNOSIS — L821 Other seborrheic keratosis: Secondary | ICD-10-CM | POA: Diagnosis not present

## 2020-01-03 ENCOUNTER — Other Ambulatory Visit: Payer: Self-pay | Admitting: Family Medicine

## 2020-01-03 DIAGNOSIS — Z1231 Encounter for screening mammogram for malignant neoplasm of breast: Secondary | ICD-10-CM

## 2020-01-06 ENCOUNTER — Other Ambulatory Visit: Payer: Self-pay

## 2020-01-06 ENCOUNTER — Ambulatory Visit
Admission: RE | Admit: 2020-01-06 | Discharge: 2020-01-06 | Disposition: A | Discharge status: Home / Self Care | Payer: Medicare Other | Source: Ambulatory Visit | Attending: Family Medicine | Admitting: Family Medicine

## 2020-01-06 DIAGNOSIS — Z1231 Encounter for screening mammogram for malignant neoplasm of breast: Secondary | ICD-10-CM

## 2020-02-08 ENCOUNTER — Other Ambulatory Visit: Payer: Medicare Other

## 2020-02-14 ENCOUNTER — Other Ambulatory Visit: Payer: Self-pay | Admitting: Family Medicine

## 2020-02-14 ENCOUNTER — Ambulatory Visit
Admission: RE | Admit: 2020-02-14 | Discharge: 2020-02-14 | Disposition: A | Discharge status: Home / Self Care | Payer: Medicare PPO | Source: Ambulatory Visit | Attending: Family Medicine | Admitting: Family Medicine

## 2020-02-14 DIAGNOSIS — S62347A Nondisplaced fracture of base of fifth metacarpal bone. left hand, initial encounter for closed fracture: Secondary | ICD-10-CM | POA: Diagnosis not present

## 2020-02-14 DIAGNOSIS — E785 Hyperlipidemia, unspecified: Secondary | ICD-10-CM | POA: Diagnosis not present

## 2020-02-14 DIAGNOSIS — Z1389 Encounter for screening for other disorder: Secondary | ICD-10-CM | POA: Diagnosis not present

## 2020-02-14 DIAGNOSIS — S60222A Contusion of left hand, initial encounter: Secondary | ICD-10-CM | POA: Diagnosis not present

## 2020-02-14 DIAGNOSIS — M8580 Other specified disorders of bone density and structure, unspecified site: Secondary | ICD-10-CM | POA: Diagnosis not present

## 2020-02-14 DIAGNOSIS — S6992XD Unspecified injury of left wrist, hand and finger(s), subsequent encounter: Secondary | ICD-10-CM

## 2020-02-14 DIAGNOSIS — Z0000 Encounter for general adult medical examination without abnormal findings: Secondary | ICD-10-CM

## 2020-02-14 DIAGNOSIS — F411 Generalized anxiety disorder: Secondary | ICD-10-CM | POA: Diagnosis not present

## 2020-02-14 DIAGNOSIS — I10 Essential (primary) hypertension: Secondary | ICD-10-CM | POA: Diagnosis not present

## 2020-02-21 ENCOUNTER — Encounter: Payer: Self-pay | Admitting: Orthopedic Surgery

## 2020-02-21 ENCOUNTER — Ambulatory Visit (INDEPENDENT_AMBULATORY_CARE_PROVIDER_SITE_OTHER): Payer: Medicare PPO | Admitting: Orthopedic Surgery

## 2020-02-21 ENCOUNTER — Other Ambulatory Visit: Payer: Self-pay

## 2020-02-21 VITALS — Ht 63.0 in | Wt 150.0 lb

## 2020-02-21 DIAGNOSIS — S62347A Nondisplaced fracture of base of fifth metacarpal bone. left hand, initial encounter for closed fracture: Secondary | ICD-10-CM

## 2020-02-27 ENCOUNTER — Encounter: Payer: Self-pay | Admitting: Orthopedic Surgery

## 2020-02-27 NOTE — Progress Notes (Signed)
Office Visit Note   Patient: Monique Thompson           Date of Birth: 10/06/1945           MRN: [id number] Visit Date: 02/21/2020 Requested by: Deatra James, MD [phone] [address] [place],  [place] [address] PCP: Deatra James, MD  Subjective: Chief Complaint  Patient presents with  . Left Hand - Fracture    HPI: Monique Thompson is a 74 y.o. female who presents to the office complaining of left hand pain.  Patient stumbled and fell about 2 weeks ago and tried to brace her fall with her hand.  She saw her primary care physician last week and had x-rays taken that revealed fifth metacarpal base fracture with minimal displacement.  She notes continued swelling though her pain has continued to improve.  She is not taking any medication for pain control.  She has no history of left hand surgery.  She does not smoke.  No significant medical history aside from hypertension, hyperlipidemia, anxiety.  Denies any elbow or shoulder pain..                ROS: All systems reviewed are negative as they relate to the chief complaint within the history of present illness.  Patient denies fevers or chills.  Assessment & Plan: Visit Diagnoses:  1. Closed nondisplaced fracture of base of fifth metacarpal bone of left hand, initial encounter     Plan: Patient is a 74 year old female presents complaining of left hand pain.  She is about 2 weeks out from a fall onto her left hand where she sustained 1/5 metacarpal base fracture with minimal displacement.  Alignment is appropriate for nonoperative management.  No rotational deformity.  She does have some swelling and motion as well as tenderness at the fracture site.  With these exam findings, plan for wrist splint and stockinette for 2 weeks.  Follow-up in 3 weeks with x-rays at that time.  If radiographs look good at that time and she has improved clinically, plan for follow-up as needed after that point.  Follow-Up Instructions: No follow-ups on file.    Orders:  No orders of the defined types were placed in this encounter.  No orders of the defined types were placed in this encounter.     Procedures: No procedures performed   Clinical Data: No additional findings.  Objective: Vital Signs: Ht 5\' 3"  (1.6 m)   Wt 150 lb (68 kg)   BMI 26.57 kg/m   Physical Exam:  Constitutional: Patient appears well-developed HEENT:  Head: Normocephalic Eyes:EOM are normal Neck: Normal range of motion Cardiovascular: Normal rate Pulmonary/chest: Effort normal Neurologic: Patient is alert Skin: Skin is warm Psychiatric: Patient has normal mood and affect  Ortho Exam: Ortho exam demonstrates left hand with mild amount of swelling over the fifth metacarpal base.  There is some motion was palpable at the fracture site with mild tenderness to palpation at the fracture site.  Mild snuffbox tenderness.  No scaphoid tubercle tenderness.  No distal radius or ulna tenderness.  No laxity with DRUJ.  Wrist extension and flexion are painless.  Ulnar nerve functioning well with intact abduction of the fingers and adduction of the fingers.  No rotational deformity of the left hand or fingers.  Specialty Comments:  No specialty comments available.  Imaging: No results found.   PMFS History: Patient Active Problem List   Diagnosis Date Noted  . HYPERTHYROIDISM 09/07/2007  . HYPERLIPIDEMIA 09/07/2007  .  DEPRESSION 09/07/2007  . HYPERTENSION 09/07/2007  . ALLERGIC RHINITIS 09/07/2007   No past medical history on file.  Family History  Problem Relation Age of Onset  . Breast cancer Neg Hx     Past Surgical History:  Procedure Laterality Date  . BREAST EXCISIONAL BIOPSY Left   . BREAST EXCISIONAL BIOPSY Left   . BREAST EXCISIONAL BIOPSY Right    Social History   Occupational History  . Not on file  Tobacco Use  . Smoking status: Never Smoker  . Smokeless tobacco: Never Used  Substance and Sexual Activity  . Alcohol use: Yes  . Drug  use: Never  . Sexual activity: Not on file

## 2020-02-28 ENCOUNTER — Encounter: Payer: Self-pay | Admitting: Orthopedic Surgery

## 2020-03-20 ENCOUNTER — Ambulatory Visit: Payer: Medicare PPO | Admitting: Orthopedic Surgery

## 2020-04-04 ENCOUNTER — Telehealth: Payer: Self-pay | Admitting: *Deleted

## 2020-04-04 NOTE — Telephone Encounter (Signed)
complete

## 2020-05-16 ENCOUNTER — Other Ambulatory Visit: Payer: Self-pay | Admitting: Obstetrics and Gynecology

## 2020-05-16 DIAGNOSIS — M8580 Other specified disorders of bone density and structure, unspecified site: Secondary | ICD-10-CM

## 2020-05-17 ENCOUNTER — Other Ambulatory Visit: Payer: Medicare PPO

## 2020-08-15 DIAGNOSIS — N949 Unspecified condition associated with female genital organs and menstrual cycle: Secondary | ICD-10-CM | POA: Diagnosis not present

## 2020-08-25 DIAGNOSIS — I10 Essential (primary) hypertension: Secondary | ICD-10-CM | POA: Diagnosis not present

## 2020-08-25 DIAGNOSIS — R413 Other amnesia: Secondary | ICD-10-CM | POA: Diagnosis not present

## 2020-08-25 DIAGNOSIS — E785 Hyperlipidemia, unspecified: Secondary | ICD-10-CM | POA: Diagnosis not present

## 2020-08-25 DIAGNOSIS — F411 Generalized anxiety disorder: Secondary | ICD-10-CM | POA: Diagnosis not present

## 2020-09-26 DIAGNOSIS — R413 Other amnesia: Secondary | ICD-10-CM | POA: Diagnosis not present

## 2020-10-04 DIAGNOSIS — H40053 Ocular hypertension, bilateral: Secondary | ICD-10-CM | POA: Diagnosis not present

## 2020-10-04 DIAGNOSIS — Z0100 Encounter for examination of eyes and vision without abnormal findings: Secondary | ICD-10-CM | POA: Diagnosis not present

## 2020-10-05 DIAGNOSIS — M545 Low back pain, unspecified: Secondary | ICD-10-CM | POA: Diagnosis not present

## 2020-10-05 DIAGNOSIS — N3000 Acute cystitis without hematuria: Secondary | ICD-10-CM | POA: Diagnosis not present

## 2020-10-10 ENCOUNTER — Other Ambulatory Visit: Payer: Self-pay

## 2020-10-10 ENCOUNTER — Ambulatory Visit
Admission: RE | Admit: 2020-10-10 | Discharge: 2020-10-10 | Disposition: A | Discharge status: Home / Self Care | Payer: Medicare PPO | Source: Ambulatory Visit | Attending: Obstetrics and Gynecology | Admitting: Obstetrics and Gynecology

## 2020-10-10 DIAGNOSIS — Z780 Asymptomatic menopausal state: Secondary | ICD-10-CM | POA: Diagnosis not present

## 2020-10-10 DIAGNOSIS — M8580 Other specified disorders of bone density and structure, unspecified site: Secondary | ICD-10-CM

## 2020-10-10 DIAGNOSIS — M8589 Other specified disorders of bone density and structure, multiple sites: Secondary | ICD-10-CM | POA: Diagnosis not present

## 2020-11-17 DIAGNOSIS — F430 Acute stress reaction: Secondary | ICD-10-CM | POA: Diagnosis not present

## 2020-11-17 DIAGNOSIS — M545 Low back pain, unspecified: Secondary | ICD-10-CM | POA: Diagnosis not present

## 2020-11-28 DIAGNOSIS — H5319 Other subjective visual disturbances: Secondary | ICD-10-CM | POA: Diagnosis not present

## 2020-12-15 ENCOUNTER — Other Ambulatory Visit: Payer: Self-pay | Admitting: Family Medicine

## 2020-12-15 DIAGNOSIS — Z1231 Encounter for screening mammogram for malignant neoplasm of breast: Secondary | ICD-10-CM

## 2020-12-19 DIAGNOSIS — M5459 Other low back pain: Secondary | ICD-10-CM | POA: Diagnosis not present

## 2020-12-26 DIAGNOSIS — M5459 Other low back pain: Secondary | ICD-10-CM | POA: Diagnosis not present

## 2021-01-02 DIAGNOSIS — M5459 Other low back pain: Secondary | ICD-10-CM | POA: Diagnosis not present

## 2021-01-04 DIAGNOSIS — M5459 Other low back pain: Secondary | ICD-10-CM | POA: Diagnosis not present

## 2021-01-09 DIAGNOSIS — M5459 Other low back pain: Secondary | ICD-10-CM | POA: Diagnosis not present

## 2021-01-12 ENCOUNTER — Ambulatory Visit
Admission: RE | Admit: 2021-01-12 | Discharge: 2021-01-12 | Disposition: A | Discharge status: Home / Self Care | Payer: Medicare PPO | Source: Ambulatory Visit | Attending: Family Medicine | Admitting: Family Medicine

## 2021-01-12 ENCOUNTER — Other Ambulatory Visit: Payer: Self-pay

## 2021-01-12 DIAGNOSIS — Z1231 Encounter for screening mammogram for malignant neoplasm of breast: Secondary | ICD-10-CM

## 2021-02-01 DIAGNOSIS — M5459 Other low back pain: Secondary | ICD-10-CM | POA: Diagnosis not present

## 2021-02-13 DIAGNOSIS — M5459 Other low back pain: Secondary | ICD-10-CM | POA: Diagnosis not present

## 2021-02-15 DIAGNOSIS — M5459 Other low back pain: Secondary | ICD-10-CM | POA: Diagnosis not present

## 2021-02-26 DIAGNOSIS — M8580 Other specified disorders of bone density and structure, unspecified site: Secondary | ICD-10-CM | POA: Diagnosis not present

## 2021-02-26 DIAGNOSIS — Z1389 Encounter for screening for other disorder: Secondary | ICD-10-CM | POA: Diagnosis not present

## 2021-02-26 DIAGNOSIS — Z0000 Encounter for general adult medical examination without abnormal findings: Secondary | ICD-10-CM | POA: Diagnosis not present

## 2021-02-26 DIAGNOSIS — R413 Other amnesia: Secondary | ICD-10-CM | POA: Diagnosis not present

## 2021-02-26 DIAGNOSIS — E785 Hyperlipidemia, unspecified: Secondary | ICD-10-CM | POA: Diagnosis not present

## 2021-02-26 DIAGNOSIS — H409 Unspecified glaucoma: Secondary | ICD-10-CM | POA: Diagnosis not present

## 2021-02-26 DIAGNOSIS — F411 Generalized anxiety disorder: Secondary | ICD-10-CM | POA: Diagnosis not present

## 2021-02-26 DIAGNOSIS — I10 Essential (primary) hypertension: Secondary | ICD-10-CM | POA: Diagnosis not present

## 2021-04-23 DIAGNOSIS — H40053 Ocular hypertension, bilateral: Secondary | ICD-10-CM | POA: Diagnosis not present

## 2021-05-16 DIAGNOSIS — I10 Essential (primary) hypertension: Secondary | ICD-10-CM | POA: Diagnosis not present

## 2021-05-16 DIAGNOSIS — J069 Acute upper respiratory infection, unspecified: Secondary | ICD-10-CM | POA: Diagnosis not present

## 2021-05-17 DIAGNOSIS — F324 Major depressive disorder, single episode, in partial remission: Secondary | ICD-10-CM | POA: Diagnosis not present

## 2021-05-17 DIAGNOSIS — Z9109 Other allergy status, other than to drugs and biological substances: Secondary | ICD-10-CM | POA: Diagnosis not present

## 2021-05-17 DIAGNOSIS — I10 Essential (primary) hypertension: Secondary | ICD-10-CM | POA: Diagnosis not present

## 2021-05-17 DIAGNOSIS — M25562 Pain in left knee: Secondary | ICD-10-CM | POA: Diagnosis not present

## 2021-07-03 DIAGNOSIS — F324 Major depressive disorder, single episode, in partial remission: Secondary | ICD-10-CM | POA: Diagnosis not present

## 2021-07-03 DIAGNOSIS — I10 Essential (primary) hypertension: Secondary | ICD-10-CM | POA: Diagnosis not present

## 2021-07-03 DIAGNOSIS — F411 Generalized anxiety disorder: Secondary | ICD-10-CM | POA: Diagnosis not present

## 2021-08-29 DIAGNOSIS — E785 Hyperlipidemia, unspecified: Secondary | ICD-10-CM | POA: Diagnosis not present

## 2021-08-29 DIAGNOSIS — F411 Generalized anxiety disorder: Secondary | ICD-10-CM | POA: Diagnosis not present

## 2021-08-29 DIAGNOSIS — G3184 Mild cognitive impairment, so stated: Secondary | ICD-10-CM | POA: Diagnosis not present

## 2021-08-29 DIAGNOSIS — I10 Essential (primary) hypertension: Secondary | ICD-10-CM | POA: Diagnosis not present

## 2021-11-06 DIAGNOSIS — H2513 Age-related nuclear cataract, bilateral: Secondary | ICD-10-CM | POA: Diagnosis not present

## 2021-11-06 DIAGNOSIS — H40053 Ocular hypertension, bilateral: Secondary | ICD-10-CM | POA: Diagnosis not present

## 2021-12-24 DIAGNOSIS — A09 Infectious gastroenteritis and colitis, unspecified: Secondary | ICD-10-CM | POA: Diagnosis not present

## 2021-12-24 DIAGNOSIS — R112 Nausea with vomiting, unspecified: Secondary | ICD-10-CM | POA: Diagnosis not present

## 2021-12-25 ENCOUNTER — Other Ambulatory Visit: Payer: Self-pay | Admitting: Family Medicine

## 2021-12-25 DIAGNOSIS — Z1231 Encounter for screening mammogram for malignant neoplasm of breast: Secondary | ICD-10-CM

## 2022-01-25 ENCOUNTER — Ambulatory Visit
Admission: RE | Admit: 2022-01-25 | Discharge: 2022-01-25 | Disposition: A | Discharge status: Home / Self Care | Payer: Medicare PPO | Source: Ambulatory Visit | Attending: Family Medicine | Admitting: Family Medicine

## 2022-01-25 DIAGNOSIS — Z1231 Encounter for screening mammogram for malignant neoplasm of breast: Secondary | ICD-10-CM | POA: Diagnosis not present

## 2022-03-18 ENCOUNTER — Emergency Department
Admission: EM | Admit: 2022-03-18 | Discharge: 2022-03-18 | Disposition: A | Discharge status: Home / Self Care | Payer: Medicare PPO | Attending: Emergency Medicine | Admitting: Emergency Medicine

## 2022-03-18 ENCOUNTER — Emergency Department: Payer: Medicare PPO

## 2022-03-18 ENCOUNTER — Other Ambulatory Visit: Payer: Self-pay

## 2022-03-18 ENCOUNTER — Encounter: Payer: Self-pay | Admitting: Emergency Medicine

## 2022-03-18 DIAGNOSIS — Z1152 Encounter for screening for COVID-19: Secondary | ICD-10-CM | POA: Diagnosis not present

## 2022-03-18 DIAGNOSIS — R058 Other specified cough: Secondary | ICD-10-CM | POA: Insufficient documentation

## 2022-03-18 DIAGNOSIS — J40 Bronchitis, not specified as acute or chronic: Secondary | ICD-10-CM | POA: Diagnosis not present

## 2022-03-18 DIAGNOSIS — R059 Cough, unspecified: Secondary | ICD-10-CM | POA: Diagnosis present

## 2022-03-18 LAB — CBC WITH DIFFERENTIAL/PLATELET
Abs Immature Granulocytes: 0.01 10*3/uL (ref 0.00–0.07)
Basophils Absolute: 0.1 10*3/uL (ref 0.0–0.1)
Basophils Relative: 1 %
Eosinophils Absolute: 0.3 10*3/uL (ref 0.0–0.5)
Eosinophils Relative: 4 %
HCT: 42.5 % (ref 36.0–46.0)
Hemoglobin: 13.6 g/dL (ref 12.0–15.0)
Immature Granulocytes: 0 %
Lymphocytes Relative: 20 %
Lymphs Abs: 1.5 10*3/uL (ref 0.7–4.0)
MCH: 27.9 pg (ref 26.0–34.0)
MCHC: 32 g/dL (ref 30.0–36.0)
MCV: 87.3 fL (ref 80.0–100.0)
Monocytes Absolute: 0.6 10*3/uL (ref 0.1–1.0)
Monocytes Relative: 8 %
Neutro Abs: 5.2 10*3/uL (ref 1.7–7.7)
Neutrophils Relative %: 67 %
Platelets: 215 10*3/uL (ref 150–400)
RBC: 4.87 MIL/uL (ref 3.87–5.11)
RDW: 13.4 % (ref 11.5–15.5)
WBC: 7.7 10*3/uL (ref 4.0–10.5)
nRBC: 0 % (ref 0.0–0.2)

## 2022-03-18 LAB — COMPREHENSIVE METABOLIC PANEL
ALT: 18 U/L (ref 0–44)
AST: 24 U/L (ref 15–41)
Albumin: 4 g/dL (ref 3.5–5.0)
Alkaline Phosphatase: 95 U/L (ref 38–126)
Anion gap: 8 (ref 5–15)
BUN: 11 mg/dL (ref 8–23)
CO2: 31 mmol/L (ref 22–32)
Calcium: 9 mg/dL (ref 8.9–10.3)
Chloride: 104 mmol/L (ref 98–111)
Creatinine, Ser: 0.8 mg/dL (ref 0.44–1.00)
GFR, Estimated: >60 mL/min (ref >60)
Glucose, Bld: 105 mg/dL — ABNORMAL HIGH (ref 70–99)
Potassium: 3.9 mmol/L (ref 3.5–5.1)
Sodium: 143 mmol/L (ref 135–145)
Total Bilirubin: 1.2 mg/dL (ref 0.3–1.2)
Total Protein: 7.3 g/dL (ref 6.5–8.1)

## 2022-03-18 LAB — RESP PANEL BY RT-PCR (RSV, FLU A&B, COVID)  RVPGX2
Influenza A by PCR: NEGATIVE
Influenza B by PCR: NEGATIVE
Resp Syncytial Virus by PCR: NEGATIVE
SARS Coronavirus 2 by RT PCR: NEGATIVE

## 2022-03-18 LAB — GROUP A STREP BY PCR: Group A Strep by PCR: NOT DETECTED

## 2022-03-18 LAB — TROPONIN I (HIGH SENSITIVITY): Troponin I (High Sensitivity): 6 ng/L (ref <18)

## 2022-03-18 LAB — LACTIC ACID, PLASMA: Lactic Acid, Venous: 1 mmol/L (ref 0.5–1.9)

## 2022-03-18 MED ORDER — ALBUTEROL SULFATE HFA 108 (90 BASE) MCG/ACT IN AERS: 2.0000 | INHALATION_SPRAY | RESPIRATORY_TRACT | 0 refills | Status: AC | PRN

## 2022-03-18 MED ORDER — IBUPROFEN 600 MG PO TABS: 600.0000 mg | ORAL_TABLET | Freq: Four times a day (QID) | ORAL | 0 refills | Status: AC | PRN

## 2022-03-18 MED ORDER — ALBUTEROL SULFATE (2.5 MG/3ML) 0.083% IN NEBU: 2.5000 mg | INHALATION_SOLUTION | RESPIRATORY_TRACT | Status: DC | PRN

## 2022-03-18 MED ORDER — HYDROCODONE BIT-HOMATROP MBR 5-1.5 MG/5ML PO SOLN: 5.0000 mL | Freq: Four times a day (QID) | ORAL | 0 refills | Status: AC | PRN

## 2022-03-18 MED ORDER — DOXYCYCLINE HYCLATE 100 MG PO CAPS: 100.0000 mg | ORAL_CAPSULE | Freq: Two times a day (BID) | ORAL | 0 refills | Status: AC

## 2022-03-18 MED ORDER — PREDNISONE 20 MG PO TABS: 40.0000 mg | ORAL_TABLET | Freq: Every day | ORAL | 0 refills | Status: AC

## 2022-03-18 MED ORDER — PREDNISONE 20 MG PO TABS
40.0000 mg | ORAL_TABLET | Freq: Once | ORAL | Status: AC
  Administered 2022-03-18: 40 mg via ORAL
  Filled 2022-03-18: qty 2

## 2022-03-18 NOTE — Discharge Instructions (Addendum)
Drink plenty of fluids  Take the antibiotic with food, and the prednisone with food in the mornings.  Take the ibuprofen as needed for fever or chills or body aches.  Take the Hycodan for cough, especially at night. This may make you drowsy so use caution.

## 2022-03-18 NOTE — ED Provider Notes (Signed)
[facility] Provider Note    Event Date/Time   First MD Initiated Contact with Patient 03/18/22 [phone]     (approximate)   History   Cough, Sore Throat, and Chest Pain   HPI  Monique Thompson is a 77 y.o. female with no major past medical history here with several days of cough, runny nose, sore throat.  Patient states that her symptoms started as a sore throat approximately 4 days ago.  Since then, she has had persistent sore throat along with dry cough occasionally productive of yellow-green sputum.  She said chills and bodyaches.  She said decreased appetite.  She took over-the-counter Tylenol without significant relief.  She states that she has had persistent sore throat and cough since then and does not feel like she has been improving.  Denies history of previous lung issues or recurrent pneumonias.  She was around a family member that had influenza over Christmas.  No other complaints.     Physical Exam   Triage Vital Signs: ED Triage Vitals  Enc Vitals Group     BP 03/18/22 0633 (!) 144/78     Pulse Rate 03/18/22 0633 89     Resp 03/18/22 0633 20     Temp 03/18/22 0633 99.3 F (37.4 C)     Temp Source 03/18/22 0633 Oral     SpO2 03/18/22 0633 95 %     Weight 03/18/22 0639 145 lb (65.8 kg)     Height 03/18/22 0639 5\' 3"  (1.6 m)     Head Circumference --      Peak Flow --      Pain Score 03/18/22 0638 6     Pain Loc --      Pain Edu? --      Excl. in [place]? --     Most recent vital signs: Vitals:   03/18/22 0633  BP: (!) 144/78  Pulse: 89  Resp: 20  Temp: 99.3 F (37.4 C)  SpO2: 95%     General: Awake, no distress.  CV:  Good peripheral perfusion.  Regular rate and rhythm. Resp:  Normal effort.  Lungs clear to auscultation.  Occasional coarse, barking type cough. Abd:  No distention.  No tenderness. Other:  Posterior pharyngeal erythema.  Moist mucous membranes.  No tonsillar swelling or exudates.   ED Results / Procedures /  Treatments   Labs (all labs ordered are listed, but only abnormal results are displayed) Labs Reviewed  COMPREHENSIVE METABOLIC PANEL - Abnormal; Notable for the following components:      Result Value   Glucose, Bld 105 (*)    All other components within normal limits  RESP PANEL BY RT-PCR (RSV, FLU A&B, COVID)  [id number]  GROUP A STREP BY PCR  LACTIC ACID, PLASMA  CBC WITH DIFFERENTIAL/PLATELET  LACTIC ACID, PLASMA  URINALYSIS, ROUTINE W REFLEX MICROSCOPIC  TROPONIN I (HIGH SENSITIVITY)  TROPONIN I (HIGH SENSITIVITY)     EKG Normal sinus rhythm, ventricular rate 87.  PR 176, QRS 80, QTc 469.  No acute ST elevations or depressions.  No acute evidence of acute ischemia or infarct.   RADIOLOGY Chest x-ray: Chronic [employer] hernia, no acute normality   I also independently reviewed and agree with radiologist interpretations.   PROCEDURES:  Critical Care performed: No    MEDICATIONS ORDERED IN ED: Medications  albuterol (PROVENTIL) (2.5 MG/3ML) 0.083% nebulizer solution 2.5 mg (has no administration in time range)  predniSONE (DELTASONE) tablet 40 mg (40 mg Oral Given  03/18/22 2947)     IMPRESSION / MDM / ASSESSMENT AND PLAN / ED COURSE  I reviewed the triage vital signs and the nursing notes.                              Differential diagnosis includes, but is not limited to, URI, [facility] pneumonia, influenza, bronchitis, pharyngitis, CHF, less likely ACS.  Patient's presentation is most consistent with acute presentation with potential threat to life or bodily function.  Overall fairly healthy 77 year old female here with several days of cough, sore throat, and generalized fatigue.  Clinically, suspect URI with possible development of early community-acquired pneumonia based on her symptoms.  She does have coarse, barking type cough, raising question of croup.  She has no evidence of hypoxia or significant increased work of breathing.  Patient given a breathing  treatment to see if this would improve her subjective shortness of breath and will start on steroids given possible component of croup.  Will place on empiric antibiotics.  Patient chest x-ray is clear.  Screening lab work is very reassuring.  CBC shows no leukocytosis or anemia.  CMP unremarkable with normal renal function.  COVID and influenza are negative.  Lactic acid is normal.  Troponin negative, EKG nonischemic, do not suspect ACS.  Will plan to treat patient as outpatient, encourage hydration, with good return precautions.  FINAL CLINICAL IMPRESSION(S) / ED DIAGNOSES   Final diagnoses:  Bronchitis     Rx / DC Orders   ED Discharge Orders          Ordered    predniSONE (DELTASONE) 20 MG tablet  Daily        03/18/22 0928    doxycycline (VIBRAMYCIN) 100 MG capsule  2 times daily        03/18/22 0928    HYDROcodone bit-homatropine (HYCODAN) 5-1.5 MG/5ML syrup  Every 6 hours PRN        03/18/22 0928    ibuprofen (ADVIL) 600 MG tablet  Every 6 hours PRN        03/18/22 0928    albuterol (VENTOLIN HFA) 108 (90 Base) MCG/ACT inhaler  Every 4 hours PRN        03/18/22 0929             Note:  This document was prepared using Dragon voice recognition software and may include unintentional dictation errors.   Duffy Bruce, MD 03/18/22 [phone]

## 2022-03-18 NOTE — ED Triage Notes (Addendum)
Pt arrived via POV with reports of sore throat, cough, chills, runny nose since Thursday. Pt states she has been around young family members that have had the flu recently.  Pt also endorsing central chest pain.

## 2022-08-19 DIAGNOSIS — R197 Diarrhea, unspecified: Secondary | ICD-10-CM | POA: Diagnosis not present

## 2022-08-19 DIAGNOSIS — R1310 Dysphagia, unspecified: Secondary | ICD-10-CM | POA: Diagnosis not present

## 2022-08-19 DIAGNOSIS — Z1211 Encounter for screening for malignant neoplasm of colon: Secondary | ICD-10-CM | POA: Diagnosis not present

## 2022-08-30 DIAGNOSIS — G3184 Mild cognitive impairment, so stated: Secondary | ICD-10-CM | POA: Diagnosis not present

## 2022-08-30 DIAGNOSIS — F411 Generalized anxiety disorder: Secondary | ICD-10-CM | POA: Diagnosis not present

## 2022-08-30 DIAGNOSIS — E785 Hyperlipidemia, unspecified: Secondary | ICD-10-CM | POA: Diagnosis not present

## 2022-08-30 DIAGNOSIS — I10 Essential (primary) hypertension: Secondary | ICD-10-CM | POA: Diagnosis not present

## 2022-09-08 DIAGNOSIS — G4733 Obstructive sleep apnea (adult) (pediatric): Secondary | ICD-10-CM | POA: Diagnosis not present

## 2022-09-24 DIAGNOSIS — Q396 Congenital diverticulum of esophagus: Secondary | ICD-10-CM | POA: Diagnosis not present

## 2022-09-24 DIAGNOSIS — R1310 Dysphagia, unspecified: Secondary | ICD-10-CM | POA: Diagnosis not present

## 2022-09-24 DIAGNOSIS — K2930 Chronic superficial gastritis without bleeding: Secondary | ICD-10-CM | POA: Diagnosis not present

## 2022-09-26 DIAGNOSIS — K2930 Chronic superficial gastritis without bleeding: Secondary | ICD-10-CM | POA: Diagnosis not present

## 2022-10-09 DIAGNOSIS — G4733 Obstructive sleep apnea (adult) (pediatric): Secondary | ICD-10-CM | POA: Diagnosis not present

## 2022-11-09 DIAGNOSIS — G4733 Obstructive sleep apnea (adult) (pediatric): Secondary | ICD-10-CM | POA: Diagnosis not present

## 2022-12-10 DIAGNOSIS — G4733 Obstructive sleep apnea (adult) (pediatric): Secondary | ICD-10-CM | POA: Diagnosis not present

## 2022-12-18 DIAGNOSIS — R42 Dizziness and giddiness: Secondary | ICD-10-CM | POA: Diagnosis not present

## 2022-12-18 DIAGNOSIS — D1039 Benign neoplasm of other parts of mouth: Secondary | ICD-10-CM | POA: Diagnosis not present

## 2023-01-06 DIAGNOSIS — G4733 Obstructive sleep apnea (adult) (pediatric): Secondary | ICD-10-CM | POA: Diagnosis not present

## 2023-01-08 DIAGNOSIS — G4733 Obstructive sleep apnea (adult) (pediatric): Secondary | ICD-10-CM | POA: Diagnosis not present

## 2023-01-09 DIAGNOSIS — G4733 Obstructive sleep apnea (adult) (pediatric): Secondary | ICD-10-CM | POA: Diagnosis not present

## 2023-01-14 ENCOUNTER — Other Ambulatory Visit: Payer: Self-pay | Admitting: Family Medicine

## 2023-01-14 DIAGNOSIS — Z1231 Encounter for screening mammogram for malignant neoplasm of breast: Secondary | ICD-10-CM

## 2023-02-09 DIAGNOSIS — G4733 Obstructive sleep apnea (adult) (pediatric): Secondary | ICD-10-CM | POA: Diagnosis not present

## 2023-02-19 ENCOUNTER — Ambulatory Visit
Admission: RE | Admit: 2023-02-19 | Discharge: 2023-02-19 | Disposition: A | Discharge status: Home / Self Care | Payer: Medicare PPO | Source: Ambulatory Visit | Attending: Family Medicine | Admitting: Family Medicine

## 2023-02-19 DIAGNOSIS — Z1231 Encounter for screening mammogram for malignant neoplasm of breast: Secondary | ICD-10-CM | POA: Diagnosis not present

## 2023-02-21 DIAGNOSIS — G4733 Obstructive sleep apnea (adult) (pediatric): Secondary | ICD-10-CM | POA: Diagnosis not present

## 2023-03-11 DIAGNOSIS — G4733 Obstructive sleep apnea (adult) (pediatric): Secondary | ICD-10-CM | POA: Diagnosis not present

## 2023-03-28 DIAGNOSIS — H16223 Keratoconjunctivitis sicca, not specified as Sjogren's, bilateral: Secondary | ICD-10-CM | POA: Diagnosis not present

## 2023-03-28 DIAGNOSIS — H2513 Age-related nuclear cataract, bilateral: Secondary | ICD-10-CM | POA: Diagnosis not present

## 2023-03-28 DIAGNOSIS — H43813 Vitreous degeneration, bilateral: Secondary | ICD-10-CM | POA: Diagnosis not present

## 2023-03-28 DIAGNOSIS — H40053 Ocular hypertension, bilateral: Secondary | ICD-10-CM | POA: Diagnosis not present

## 2023-04-04 DIAGNOSIS — H409 Unspecified glaucoma: Secondary | ICD-10-CM | POA: Diagnosis not present

## 2023-04-04 DIAGNOSIS — I10 Essential (primary) hypertension: Secondary | ICD-10-CM | POA: Diagnosis not present

## 2023-04-04 DIAGNOSIS — Z0000 Encounter for general adult medical examination without abnormal findings: Secondary | ICD-10-CM | POA: Diagnosis not present

## 2023-04-04 DIAGNOSIS — F411 Generalized anxiety disorder: Secondary | ICD-10-CM | POA: Diagnosis not present

## 2023-04-04 DIAGNOSIS — F324 Major depressive disorder, single episode, in partial remission: Secondary | ICD-10-CM | POA: Diagnosis not present

## 2023-04-04 DIAGNOSIS — M8580 Other specified disorders of bone density and structure, unspecified site: Secondary | ICD-10-CM | POA: Diagnosis not present

## 2023-04-04 DIAGNOSIS — E785 Hyperlipidemia, unspecified: Secondary | ICD-10-CM | POA: Diagnosis not present

## 2023-04-04 DIAGNOSIS — H269 Unspecified cataract: Secondary | ICD-10-CM | POA: Diagnosis not present

## 2023-04-04 DIAGNOSIS — G3184 Mild cognitive impairment, so stated: Secondary | ICD-10-CM | POA: Diagnosis not present

## 2023-04-11 DIAGNOSIS — G4733 Obstructive sleep apnea (adult) (pediatric): Secondary | ICD-10-CM | POA: Diagnosis not present

## 2023-04-14 DIAGNOSIS — H2512 Age-related nuclear cataract, left eye: Secondary | ICD-10-CM | POA: Diagnosis not present

## 2023-05-12 DIAGNOSIS — G4733 Obstructive sleep apnea (adult) (pediatric): Secondary | ICD-10-CM | POA: Diagnosis not present

## 2023-06-02 ENCOUNTER — Ambulatory Visit: Payer: Medicare PPO | Admitting: Dermatology

## 2023-06-09 DIAGNOSIS — G4733 Obstructive sleep apnea (adult) (pediatric): Secondary | ICD-10-CM | POA: Diagnosis not present

## 2023-06-11 ENCOUNTER — Encounter: Payer: Self-pay | Admitting: Ophthalmology

## 2023-06-11 ENCOUNTER — Ambulatory Visit: Payer: Medicare PPO | Admitting: Dermatology

## 2023-06-11 ENCOUNTER — Encounter: Payer: Self-pay | Admitting: Dermatology

## 2023-06-11 VITALS — BP 151/101

## 2023-06-11 DIAGNOSIS — L649 Androgenic alopecia, unspecified: Secondary | ICD-10-CM | POA: Diagnosis not present

## 2023-06-11 DIAGNOSIS — L219 Seborrheic dermatitis, unspecified: Secondary | ICD-10-CM | POA: Diagnosis not present

## 2023-06-11 DIAGNOSIS — L249 Irritant contact dermatitis, unspecified cause: Secondary | ICD-10-CM

## 2023-06-11 MED ORDER — SAFETY SEAL MISCELLANEOUS MISC: 2 refills | Status: DC

## 2023-06-11 MED ORDER — CLOBETASOL PROPIONATE 0.05 % EX SOLN: CUTANEOUS | 2 refills | Status: AC

## 2023-06-11 NOTE — Patient Instructions (Addendum)
 Hello Monique Thompson,  Thank you for visiting today. Here is a summary of the key instructions:  - Medications:   - Apply DermaSmooth oil to scalp a few hours before shampooing or overnight   - Use DermaSmooth oil as a spot treatment for itching  - Prescriptions:   - Apply minoxidil 8% with finasteride to affected areas every morning   - DermaSmooth oil will be sent to your regular pharmacy   - Minoxidil prescription will be sent to [facility] compounding pharmacy  - Supplements:   - Take Viviscal, 2 tablets daily   - Start Vital Proteins collagen powder (mix with morning coffee or a smoothie)  - Hair Care:   - Bring your own shampoo to the beauty parlor   - Consider applying oil to your scalp before hairdresser appointments  - Follow-up:   - Return for a follow-up appointment in 4 months  Please reach out if you have any questions or concerns.  Warm regards,  Dr. Langston Reusing Dermatology             Important Information  Due to recent changes in healthcare laws, you may see results of your pathology and/or laboratory studies on MyChart before the doctors have had a chance to review them. We understand that in some cases there may be results that are confusing or concerning to you. Please understand that not all results are received at the same time and often the doctors may need to interpret multiple results in order to provide you with the best plan of care or course of treatment. Therefore, we ask that you please give [place] 2 business days to thoroughly review all your results before contacting the office for clarification. Should we see a critical lab result, you will be contacted sooner.   If You Need Anything After Your Visit  If you have any questions or concerns for your doctor, please call our main line at [phone] If no one answers, please leave a voicemail as directed and we will return your call as soon as possible. Messages left after 4 pm will be answered  the following business day.   You may also send [place] a message via MyChart. We typically respond to MyChart messages within 1-2 business days.  For prescription refills, please ask your pharmacy to contact our office. Our fax number is [phone].  If you have an urgent issue when the clinic is closed that cannot wait until the next business day, you can page your doctor at the number below.    Please note that while we do our best to be available for urgent issues outside of office hours, we are not available 24/7.   If you have an urgent issue and are unable to reach [place], you may choose to seek medical care at your doctor's office, retail clinic, urgent care center, or emergency room.  If you have a medical emergency, please immediately call 911 or go to the emergency department. In the event of inclement weather, please call our main line at [phone] for an update on the status of any delays or closures.  Dermatology Medication Tips: Please keep the boxes that topical medications come in in order to help keep track of the instructions about where and how to use these. Pharmacies typically print the medication instructions only on the boxes and not directly on the medication tubes.   If your medication is too expensive, please contact our office at [phone] or send [place] a message through MyChart.  We are unable to tell what your co-pay for medications will be in advance as this is different depending on your insurance coverage. However, we may be able to find a substitute medication at lower cost or fill out paperwork to get insurance to cover a needed medication.   If a prior authorization is required to get your medication covered by your insurance company, please allow [place] 1-2 business days to complete this process.  Drug prices often vary depending on where the prescription is filled and some pharmacies may offer cheaper prices.  The website www.goodrx.com contains coupons for  medications through different pharmacies. The prices here do not account for what the cost may be with help from insurance (it may be cheaper with your insurance), but the website can give you the price if you did not use any insurance.  - You can print the associated coupon and take it with your prescription to the pharmacy.  - You may also stop by our office during regular business hours and pick up a GoodRx coupon card.  - If you need your prescription sent electronically to a different pharmacy, notify our office through [facility] or by phone at [phone]

## 2023-06-11 NOTE — Progress Notes (Signed)
 New Patient Visit   Subjective  Monique Thompson is a 78 y.o. female who presents for the following: New Pt - Hair Loss/Thinning  Patient states she has hair loss and thinning located at the scalp that she would like to have examined. Patient reports the areas have been there for 2 years. She reports the areas are bothersome.Patient rates irritation (itchy & tender)9 out of 10. She states that the areas have not spread. Patient reports she has not previously been treated for these areas. Patient denied Hx of bx. Patient denied family history of skin cancer(s).  The following portions of the chart were reviewed this encounter and updated as appropriate: medications, allergies, medical history  Review of Systems:  No other skin or systemic complaints except as noted in HPI or Assessment and Plan.  Objective  Well appearing patient in no apparent distress; mood and affect are within normal limits.   A focused examination was performed of the following areas: scalp   Relevant exam findings are noted in the Assessment and Plan.              Assessment & Plan   ANDROGENETIC ALOPECIA (FEMALE PATTERN HAIR LOSS) Exam: Diffuse thinning of the crown and widening of the midline part with retention of the frontal hairline  flared  - Assessment: Hair thinning on bilateral temples, parietal scalp, and vertex. Pattern consistent with androgenetic alopecia, likely due to post-menopausal hormonal changes. Condition attributed to genetic predisposition and natural hormonal shifts.  - Plan:    Prescribe compounded 8% minoxidil with finasteride topical solution, to be applied daily to affected areas    Recommend Viviscal supplement, 2 tablets daily    Consider upgrading to Nutrafol (4 tablets daily) if unsatisfied with results after 6-9 months    Suggest Vital Proteins collagen powder supplement    Follow-up appointment in 4 months   IRRITANT CONTACT DERMATITIS/SEB DERM OF SCALP Exam: Scaly  pink papules and/or plaques  - Assessment: Intense itching and tenderness of the scalp, possibly related to sensitivity to hair products used at the beauty parlor. Symptoms consistent with irritant dermatitis.  - Plan:    Prescribe DermaSmooth oil (steroid-containing) for application to scalp before shampooing    Advise patient to bring own shampoo to beauty parlor    Instruct on use of DermaSmooth as spot treatment for itching between salon visits   Long term medication management.  Patient is using long term (months to years) prescription medication  to control their dermatologic condition.  These medications require periodic monitoring to evaluate for efficacy and side effects and may require periodic laboratory monitoring.    IRRITANT CONTACT DERMATITIS, UNSPECIFIED TRIGGER   Related Medications clobetasol (TEMOVATE) 0.05 % external solution Apply to scalp a few hours prior to shampooing. Can also use as a spot treatment as needed between washes. SEBORRHEIC DERMATITIS   Related Medications clobetasol (TEMOVATE) 0.05 % external solution Apply to scalp a few hours prior to shampooing. Can also use as a spot treatment as needed between washes. ANDROGENIC ALOPECIA   Related Medications Safety Seal Miscellaneous MISC Brogaine with minoxidil usp 7% and finasteride usp 0.1% - use every morning on affected areas of the scalp applying with a q-tip or cotton ball daily.  Return in about 4 months (around 10/11/2023) for alopecia.    Documentation: I have reviewed the above documentation for accuracy and completeness, and I agree with the above.   I, Shirron Marcha Solders, CMA, am acting as Neurosurgeon for [employer], DO.  Langston Reusing, DO

## 2023-06-12 NOTE — Anesthesia Preprocedure Evaluation (Addendum)
 Anesthesia Evaluation  Patient identified by MRN, date of birth, ID band Patient awake    Reviewed: Allergy & Precautions, H&P , NPO status , Patient's Chart, lab work & pertinent test results  Airway Mallampati: II  TM Distance: >3 FB Neck ROM: Full    Dental no notable dental hx. (+) Caps Multiple teeth caps, crowns, no chips, all teeth intact:   Pulmonary neg pulmonary ROS, sleep apnea    Pulmonary exam normal breath sounds clear to auscultation       Cardiovascular hypertension, Normal cardiovascular exam Rhythm:Regular Rate:Normal     Neuro/Psych  Headaches PSYCHIATRIC DISORDERS  Depression    negative neurological ROS  negative psych ROS   GI/Hepatic negative GI ROS, Neg liver ROS, hiatal hernia,GERD  ,,  Endo/Other  negative endocrine ROS    Renal/GU negative Renal ROS  negative genitourinary   Musculoskeletal negative musculoskeletal ROS (+)    Abdominal   Peds negative pediatric ROS (+)  Hematology negative hematology ROS (+)   Anesthesia Other Findings Hypertension  Sleep apnea Vertigo Migraine headache GERD (gastroesophageal reflux disease) History of hiatal hernia    Reproductive/Obstetrics negative OB ROS                              Anesthesia Physical Anesthesia Plan  ASA: 2  Anesthesia Plan: MAC   Post-op Pain Management:    Induction: Intravenous  PONV Risk Score and Plan:   Airway Management Planned: Natural Airway and Nasal Cannula  Additional Equipment:   Intra-op Plan:   Post-operative Plan:   Informed Consent: I have reviewed the patients History and Physical, chart, labs and discussed the procedure including the risks, benefits and alternatives for the proposed anesthesia with the patient or authorized representative who has indicated his/her understanding and acceptance.     Dental Advisory Given  Plan Discussed with: Anesthesiologist, CRNA  and Surgeon  Anesthesia Plan Comments: (Patient consented for risks of anesthesia including but not limited to:  - adverse reactions to medications - damage to eyes, teeth, lips or other oral mucosa - nerve damage due to positioning  - sore throat or hoarseness - Damage to heart, brain, nerves, lungs, other parts of body or loss of life  Patient voiced understanding and assent.)         Anesthesia Quick Evaluation

## 2023-06-16 NOTE — Discharge Instructions (Signed)

## 2023-06-17 ENCOUNTER — Ambulatory Visit
Admission: RE | Admit: 2023-06-17 | Discharge: 2023-06-17 | Disposition: A | Discharge status: Home / Self Care | Payer: Medicare PPO | Attending: Ophthalmology | Admitting: Ophthalmology

## 2023-06-17 ENCOUNTER — Encounter: Payer: Self-pay | Admitting: Ophthalmology

## 2023-06-17 ENCOUNTER — Ambulatory Visit: Payer: Self-pay | Admitting: Anesthesiology

## 2023-06-17 ENCOUNTER — Other Ambulatory Visit: Payer: Self-pay

## 2023-06-17 ENCOUNTER — Encounter
Admission: RE | Disposition: A | Discharge status: Home / Self Care | Payer: Self-pay | Source: Home / Self Care | Attending: Ophthalmology

## 2023-06-17 DIAGNOSIS — H2512 Age-related nuclear cataract, left eye: Secondary | ICD-10-CM | POA: Diagnosis not present

## 2023-06-17 DIAGNOSIS — K449 Diaphragmatic hernia without obstruction or gangrene: Secondary | ICD-10-CM | POA: Insufficient documentation

## 2023-06-17 DIAGNOSIS — F32A Depression, unspecified: Secondary | ICD-10-CM | POA: Diagnosis not present

## 2023-06-17 DIAGNOSIS — G4730 Sleep apnea, unspecified: Secondary | ICD-10-CM | POA: Insufficient documentation

## 2023-06-17 DIAGNOSIS — I10 Essential (primary) hypertension: Secondary | ICD-10-CM | POA: Diagnosis not present

## 2023-06-17 DIAGNOSIS — K219 Gastro-esophageal reflux disease without esophagitis: Secondary | ICD-10-CM | POA: Diagnosis not present

## 2023-06-17 HISTORY — DX: Sleep apnea, unspecified: G47.30

## 2023-06-17 HISTORY — DX: Personal history of other diseases of the digestive system: Z87.19

## 2023-06-17 HISTORY — DX: Gastro-esophageal reflux disease without esophagitis: K21.9

## 2023-06-17 HISTORY — DX: Essential (primary) hypertension: I10

## 2023-06-17 HISTORY — DX: Dizziness and giddiness: R42

## 2023-06-17 HISTORY — DX: Migraine, unspecified, not intractable, without status migrainosus: G43.909

## 2023-06-17 HISTORY — PX: CATARACT EXTRACTION EXTRACAPSULAR: SHX1305

## 2023-06-17 SURGERY — EXTRACTION, CATARACT, WITH IOL INSERTION
Anesthesia: Monitor Anesthesia Care | Site: Eye | Laterality: Left

## 2023-06-17 MED ORDER — TETRACAINE HCL 0.5 % OP SOLN
OPHTHALMIC | Status: AC
  Filled 2023-06-17: qty 4

## 2023-06-17 MED ORDER — TETRACAINE HCL 0.5 % OP SOLN
1.0000 [drp] | OPHTHALMIC | Status: DC | PRN
  Administered 2023-06-17 (×3): 1 [drp] via OPHTHALMIC

## 2023-06-17 MED ORDER — ARMC OPHTHALMIC DILATING DROPS
OPHTHALMIC | Status: AC
  Filled 2023-06-17: qty 0.5

## 2023-06-17 MED ORDER — SIGHTPATH DOSE#1 BSS IO SOLN
INTRAOCULAR | Status: DC | PRN
  Administered 2023-06-17: 15 mL via INTRAOCULAR

## 2023-06-17 MED ORDER — SODIUM CHLORIDE 0.9% FLUSH
INTRAVENOUS | Status: DC | PRN
  Administered 2023-06-17: 10 mL via INTRAVENOUS

## 2023-06-17 MED ORDER — ARMC OPHTHALMIC DILATING DROPS
1.0000 | OPHTHALMIC | Status: DC | PRN
  Administered 2023-06-17 (×3): 1 via OPHTHALMIC

## 2023-06-17 MED ORDER — MIDAZOLAM HCL 2 MG/2ML IJ SOLN
INTRAMUSCULAR | Status: AC
  Filled 2023-06-17: qty 2

## 2023-06-17 MED ORDER — MOXIFLOXACIN HCL 0.5 % OP SOLN
OPHTHALMIC | Status: DC | PRN
  Administered 2023-06-17: .2 mL via OPHTHALMIC

## 2023-06-17 MED ORDER — SIGHTPATH DOSE#1 BSS IO SOLN
INTRAOCULAR | Status: DC | PRN
  Administered 2023-06-17: 52 mL via OPHTHALMIC

## 2023-06-17 MED ORDER — FENTANYL CITRATE (PF) 100 MCG/2ML IJ SOLN
INTRAMUSCULAR | Status: AC
  Filled 2023-06-17: qty 2

## 2023-06-17 MED ORDER — FENTANYL CITRATE (PF) 100 MCG/2ML IJ SOLN
INTRAMUSCULAR | Status: DC | PRN
  Administered 2023-06-17: 50 ug via INTRAVENOUS

## 2023-06-17 MED ORDER — MIDAZOLAM HCL 2 MG/2ML IJ SOLN
INTRAMUSCULAR | Status: DC | PRN
  Administered 2023-06-17: 1 mg via INTRAVENOUS

## 2023-06-17 MED ORDER — LIDOCAINE HCL (PF) 2 % IJ SOLN
INTRAOCULAR | Status: DC | PRN
  Administered 2023-06-17: 2 mL

## 2023-06-17 MED ORDER — SIGHTPATH DOSE#1 NA CHONDROIT SULF-NA HYALURON 40-17 MG/ML IO SOLN
INTRAOCULAR | Status: DC | PRN
Start: 2023-06-17 — End: 2023-06-17
  Administered 2023-06-17: 1 mL via INTRAOCULAR

## 2023-06-17 MED ORDER — BRIMONIDINE TARTRATE-TIMOLOL 0.2-0.5 % OP SOLN
OPHTHALMIC | Status: DC | PRN
  Administered 2023-06-17: 1 [drp] via OPHTHALMIC

## 2023-06-17 SURGICAL SUPPLY — 13 items
CATARACT SUITE SIGHTPATH (MISCELLANEOUS) ×1 IMPLANT
CYSTOTOME ANG REV CUT SHRT 25G (CUTTER) ×1 IMPLANT
CYSTOTOME ANGL RVRS SHRT 25G (CUTTER) ×1 IMPLANT
FEE CATARACT SUITE SIGHTPATH (MISCELLANEOUS) ×1 IMPLANT
GLOVE BIOGEL PI IND STRL 8 (GLOVE) ×1 IMPLANT
GLOVE SURG LX STRL 8.0 MICRO (GLOVE) ×1 IMPLANT
GLOVE SURG PROTEXIS BL SZ6.5 (GLOVE) ×1 IMPLANT
GLOVE SURG SYN 6.5 PF PI BL (GLOVE) ×1 IMPLANT
LENS CLAREON VIVITY IOL 22.5 ×1 IMPLANT
LENS IOL CLRN VT YLW 22.5 IMPLANT
NDL FILTER BLUNT 18X1 1/2 (NEEDLE) ×1 IMPLANT
NEEDLE FILTER BLUNT 18X1 1/2 (NEEDLE) ×1 IMPLANT
SYR 3ML LL SCALE MARK (SYRINGE) ×1 IMPLANT

## 2023-06-17 NOTE — H&P (Signed)
 [facility]   Primary Care Physician:  Deatra James, MD Ophthalmologist: Dr. Druscilla Brownie  Pre-Procedure History & Physical: HPI:  Monique Thompson is a 78 y.o. female here for cataract surgery.   Past Medical History:  Diagnosis Date   GERD (gastroesophageal reflux disease)    History of hiatal hernia    Hypertension    Migraine headache    In past, none recently   Sleep apnea    CPAP   Vertigo    rare    Past Surgical History:  Procedure Laterality Date   ABDOMINAL HYSTERECTOMY     BREAST EXCISIONAL BIOPSY Left    BREAST EXCISIONAL BIOPSY Left    BREAST EXCISIONAL BIOPSY Right     Prior to Admission medications   Medication Sig Start Date End Date Taking? Authorizing Provider  albuterol (VENTOLIN HFA) 108 (90 Base) MCG/ACT inhaler Inhale 2 puffs into the lungs every 4 (four) hours as needed for wheezing or shortness of breath. 03/18/22  Yes Shaune Pollack, MD  alendronate (FOSAMAX) 70 MG tablet Take 70 mg by mouth once a week. Take with a full glass of water on an empty stomach.   Yes [provider]  amLODipine (NORVASC) 5 MG tablet  11/25/12  Yes [provider]  atorvastatin (LIPITOR) 40 MG tablet  02/07/20  Yes [provider]  Cholecalciferol (VITAMIN D-3) 125 MCG (5000 UT) TABS Take by mouth.   Yes [provider]  FLUoxetine (PROZAC) 40 MG capsule Take 40 mg by mouth daily.   Yes [provider]  ibuprofen (ADVIL) 600 MG tablet Take 1 tablet (600 mg total) by mouth every 6 (six) hours as needed for fever or moderate pain. 03/18/22  Yes Shaune Pollack, MD  latanoprost (XALATAN) 0.005 % ophthalmic solution 1 drop at bedtime.   Yes [provider]  lisinopril (PRINIVIL,ZESTRIL) 10 MG tablet Take by mouth.   Yes [provider]  LORazepam (ATIVAN) 0.5 MG tablet Take 0.5 mg by mouth every 8 (eight) hours.   Yes [provider]  Multiple Minerals-Vitamins (CITRACAL PLUS BONE DENSITY PO) Take by mouth.    Yes [provider]  Multiple Vitamin (MULTIVITAMIN) tablet Take 1 tablet by mouth daily.   Yes [provider]  clobetasol (TEMOVATE) 0.05 % external solution Apply to scalp a few hours prior to shampooing. Can also use as a spot treatment as needed between washes. 06/11/23   Terri Piedra, DO  HYDROcodone bit-homatropine (HYCODAN) 5-1.5 MG/5ML syrup Take 5 mLs by mouth every 6 (six) hours as needed for cough. Patient not taking: Reported on 06/11/2023 03/18/22   Shaune Pollack, MD  Safety Seal Miscellaneous MISC Brogaine with minoxidil usp 7% and finasteride usp 0.1% - use every morning on affected areas of the scalp applying with a q-tip or cotton ball daily. 06/11/23   Terri Piedra, DO    Allergies as of 04/01/2023 - Review Complete 03/18/2022  Allergen Reaction Noted   Tape  08/29/2021   Latex Rash and Hives 04/07/2014   Other Rash 09/11/2021    Family History  Problem Relation Age of Onset   Breast cancer Neg Hx    BRCA 1/2 Neg Hx     Social History   Socioeconomic History   Marital status: Married    Spouse name: Not on file   Number of children: Not on file   Years of education: Not on file   Highest education level: Not on file  Occupational History   Not  on file  Tobacco Use   Smoking status: Never   Smokeless tobacco: Never  Vaping Use   Vaping status: Never Used  Substance and Sexual Activity   Alcohol use: Yes    Comment: Rare   Drug use: Never   Sexual activity: Not on file  Other Topics Concern   Not on file  Social History Narrative   Not on file   Social Drivers of Health   Financial Resource Strain: Not on file  Food Insecurity: Not on file  Transportation Needs: Not on file  Physical Activity: Not on file  Stress: Not on file  Social Connections: Not on file  Intimate Partner Violence: Not on file    Review of Systems: See HPI, otherwise negative ROS  Physical Exam: BP 139/79   Pulse 69   Temp 98 F (36.7 C)  (Temporal)   Resp 18   Ht 5\' 3"  (1.6 m)   Wt 66.2 kg   SpO2 97%   BMI 25.86 kg/m  General:   Alert, cooperative in NAD Head:  Normocephalic and atraumatic. Respiratory:  Normal work of breathing. Cardiovascular:  RRR  Impression/Plan: Monique Thompson is here for cataract surgery.  Risks, benefits, limitations, and alternatives regarding cataract surgery have been reviewed with the patient.  Questions have been answered.  All parties agreeable.   Galen Manila, MD  06/17/2023, 8:54 AM

## 2023-06-17 NOTE — Transfer of Care (Signed)
 Immediate Anesthesia Transfer of Care Note  Patient: Monique Thompson  Procedure(s) Performed: EXTRACTION, CATARACT, WITH IOL INSERTION 13.14 01:09.9 (Left: Eye)  Patient Location: PACU  Anesthesia Type:MAC  Level of Consciousness: awake and alert   Airway & Oxygen Therapy: Patient Spontanous Breathing  Post-op Assessment: Report given to RN and Post -op Vital signs reviewed and stable  Post vital signs: Reviewed and stable  Last Vitals:  Vitals Value Taken Time  BP 144/86 06/17/23 0917  Temp 36 C 06/17/23 0917  Pulse 64 06/17/23 0920  Resp 14 06/17/23 0920  SpO2 96 % 06/17/23 0920  Vitals shown include unfiled device data.  Last Pain:  Vitals:   06/17/23 0917  TempSrc:   PainSc: 0-No pain         Complications: No notable events documented.

## 2023-06-17 NOTE — Op Note (Signed)
 PREOPERATIVE DIAGNOSIS:  Nuclear sclerotic cataract of the left eye.   POSTOPERATIVE DIAGNOSIS:  Nuclear sclerotic cataract of the left eye.   OPERATIVE PROCEDURE:ORPROCALL@   SURGEON:  Galen Manila, MD.   ANESTHESIA:  Anesthesiologist: Marisue Humble, MD CRNA: Genia Del, CRNA  1.      Managed anesthesia care. 2.     0.53ml of Shugarcaine was instilled following the paracentesis   COMPLICATIONS:  None.   TECHNIQUE:   Stop and chop   DESCRIPTION OF PROCEDURE:  The patient was examined and consented in the preoperative holding area where the aforementioned topical anesthesia was applied to the left eye and then brought back to the Operating Room where the left eye was prepped and draped in the usual sterile ophthalmic fashion and a lid speculum was placed. A paracentesis was created with the side port blade and the anterior chamber was filled with viscoelastic. A near clear corneal incision was performed with the steel keratome. A continuous curvilinear capsulorrhexis was performed with a cystotome followed by the capsulorrhexis forceps. Hydrodissection and hydrodelineation were carried out with BSS on a blunt cannula. The lens was removed in a stop and chop  technique and the remaining cortical material was removed with the irrigation-aspiration handpiece. The capsular bag was inflated with viscoelastic and the Technis ZCB00 lens was placed in the capsular bag without complication. The remaining viscoelastic was removed from the eye with the irrigation-aspiration handpiece. The wounds were hydrated. The anterior chamber was flushed with BSS and the eye was inflated to physiologic pressure. 0.5ml Vigamox was placed in the anterior chamber. The wounds were found to be water tight. The eye was dressed with Combigan. The patient was given protective glasses to wear throughout the day and a shield with which to sleep tonight. The patient was also given drops with which to begin a drop regimen  today and will follow-up with me in one day. Implant Name Type Inv. Item Serial No. Manufacturer Lot No. LRB No. Used Action  LENS CLAREON VIVITY IOL 22.5 - [id number]  LENS CLAREON VIVITY IOL 22.5 [id number] [employer]  Left 1 Implanted    Procedure(s): EXTRACTION, CATARACT, WITH IOL INSERTION 13.14 01:09.9 (Left)  Electronically signed: Galen Manila 06/17/2023 9:17 AM

## 2023-06-17 NOTE — Anesthesia Postprocedure Evaluation (Signed)
 Anesthesia Post Note  Patient: Monique Thompson  Procedure(s) Performed: EXTRACTION, CATARACT, WITH IOL INSERTION 13.14 01:09.9 (Left: Eye)  Patient location during evaluation: PACU Anesthesia Type: MAC Level of consciousness: awake and alert Pain management: pain level controlled Vital Signs Assessment: post-procedure vital signs reviewed and stable Respiratory status: spontaneous breathing, nonlabored ventilation, respiratory function stable and patient connected to nasal cannula oxygen Cardiovascular status: stable and blood pressure returned to baseline Postop Assessment: no apparent nausea or vomiting Anesthetic complications: no   No notable events documented.   Last Vitals:  Vitals:   06/17/23 0918 06/17/23 0922  BP: (!) 144/86 136/84  Pulse: 63 62  Resp: (!) 7 11  Temp: (!) 36 C   SpO2: 97% 95%    Last Pain:  Vitals:   06/17/23 0922  TempSrc:   PainSc: 0-No pain                 Marisue Humble

## 2023-06-18 DIAGNOSIS — H2511 Age-related nuclear cataract, right eye: Secondary | ICD-10-CM | POA: Diagnosis not present

## 2023-06-23 ENCOUNTER — Encounter: Payer: Self-pay | Admitting: Anesthesiology

## 2023-07-01 ENCOUNTER — Ambulatory Visit: Admit: 2023-07-01 | Payer: Medicare PPO | Admitting: Ophthalmology

## 2023-07-01 SURGERY — PHACOEMULSIFICATION, CATARACT, WITH IOL INSERTION
Anesthesia: Topical | Laterality: Right

## 2023-07-07 DIAGNOSIS — G4733 Obstructive sleep apnea (adult) (pediatric): Secondary | ICD-10-CM | POA: Diagnosis not present

## 2023-07-10 DIAGNOSIS — G4733 Obstructive sleep apnea (adult) (pediatric): Secondary | ICD-10-CM | POA: Diagnosis not present

## 2023-07-14 DIAGNOSIS — N907 Vulvar cyst: Secondary | ICD-10-CM | POA: Diagnosis not present

## 2023-07-14 DIAGNOSIS — Z1211 Encounter for screening for malignant neoplasm of colon: Secondary | ICD-10-CM | POA: Diagnosis not present

## 2023-07-14 DIAGNOSIS — N949 Unspecified condition associated with female genital organs and menstrual cycle: Secondary | ICD-10-CM | POA: Diagnosis not present

## 2023-07-30 DIAGNOSIS — L821 Other seborrheic keratosis: Secondary | ICD-10-CM | POA: Diagnosis not present

## 2023-07-30 DIAGNOSIS — L820 Inflamed seborrheic keratosis: Secondary | ICD-10-CM | POA: Diagnosis not present

## 2023-08-06 DIAGNOSIS — G4733 Obstructive sleep apnea (adult) (pediatric): Secondary | ICD-10-CM | POA: Diagnosis not present

## 2023-08-07 DIAGNOSIS — Z1211 Encounter for screening for malignant neoplasm of colon: Secondary | ICD-10-CM | POA: Diagnosis not present

## 2023-08-07 DIAGNOSIS — K648 Other hemorrhoids: Secondary | ICD-10-CM | POA: Diagnosis not present

## 2023-08-07 DIAGNOSIS — K5730 Diverticulosis of large intestine without perforation or abscess without bleeding: Secondary | ICD-10-CM | POA: Diagnosis not present

## 2023-08-07 DIAGNOSIS — D123 Benign neoplasm of transverse colon: Secondary | ICD-10-CM | POA: Diagnosis not present

## 2023-08-09 DIAGNOSIS — G4733 Obstructive sleep apnea (adult) (pediatric): Secondary | ICD-10-CM | POA: Diagnosis not present

## 2023-08-12 DIAGNOSIS — D123 Benign neoplasm of transverse colon: Secondary | ICD-10-CM | POA: Diagnosis not present

## 2023-08-13 ENCOUNTER — Encounter: Payer: Self-pay | Admitting: Ophthalmology

## 2023-08-13 ENCOUNTER — Other Ambulatory Visit: Payer: Self-pay

## 2023-08-13 DIAGNOSIS — L649 Androgenic alopecia, unspecified: Secondary | ICD-10-CM

## 2023-08-13 MED ORDER — SAFETY SEAL MISCELLANEOUS MISC: 2 refills | Status: AC

## 2023-08-13 NOTE — Anesthesia Preprocedure Evaluation (Addendum)
 Anesthesia Evaluation  Patient identified by MRN, date of birth, ID band Patient awake    Reviewed: Allergy & Precautions, H&P , NPO status , Patient's Chart, lab work & pertinent test results  Airway Mallampati: II  TM Distance: >3 FB Neck ROM: Full    Dental no notable dental hx.  Multiple teeth caps, crowns, no chips, all teeth intact: :   Pulmonary neg pulmonary ROS, sleep apnea    Pulmonary exam normal breath sounds clear to auscultation       Cardiovascular hypertension, negative cardio ROS Normal cardiovascular exam Rhythm:Regular Rate:Normal     Neuro/Psych  Headaches PSYCHIATRIC DISORDERS  Depression    negative neurological ROS  negative psych ROS   GI/Hepatic negative GI ROS, Neg liver ROS, hiatal hernia,GERD  ,,  Endo/Other  negative endocrine ROS    Renal/GU negative Renal ROS  negative genitourinary   Musculoskeletal negative musculoskeletal ROS (+)    Abdominal   Peds negative pediatric ROS (+)  Hematology negative hematology ROS (+)   Anesthesia Other Findings Previous cataract surgery 06-17-23 Dr. Aldo Amble   Hypertension             Sleep apnea Vertigo Migraine headache GERD (gastroesophageal reflux disease) History of hiatal hernia     Reproductive/Obstetrics negative OB ROS                             Anesthesia Physical Anesthesia Plan  ASA: 2  Anesthesia Plan: MAC   Post-op Pain Management:    Induction: Intravenous  PONV Risk Score and Plan:   Airway Management Planned: Natural Airway and Nasal Cannula  Additional Equipment:   Intra-op Plan:   Post-operative Plan:   Informed Consent: I have reviewed the patients History and Physical, chart, labs and discussed the procedure including the risks, benefits and alternatives for the proposed anesthesia with the patient or authorized representative who has indicated his/her understanding and acceptance.      Dental Advisory Given  Plan Discussed with: Anesthesiologist, CRNA and Surgeon  Anesthesia Plan Comments: (Patient consented for risks of anesthesia including but not limited to:  - adverse reactions to medications - damage to eyes, teeth, lips or other oral mucosa - nerve damage due to positioning  - sore throat or hoarseness - Damage to heart, brain, nerves, lungs, other parts of body or loss of life  Patient voiced understanding and assent.)        Anesthesia Quick Evaluation

## 2023-08-13 NOTE — Progress Notes (Signed)
 Refill request

## 2023-08-21 NOTE — Discharge Instructions (Signed)

## 2023-08-26 ENCOUNTER — Encounter: Payer: Self-pay | Admitting: Ophthalmology

## 2023-08-26 ENCOUNTER — Ambulatory Visit: Payer: Self-pay | Admitting: Anesthesiology

## 2023-08-26 ENCOUNTER — Encounter
Admission: RE | Disposition: A | Discharge status: Home / Self Care | Payer: Self-pay | Source: Home / Self Care | Attending: Ophthalmology

## 2023-08-26 ENCOUNTER — Ambulatory Visit
Admission: RE | Admit: 2023-08-26 | Discharge: 2023-08-26 | Disposition: A | Discharge status: Home / Self Care | Attending: Ophthalmology | Admitting: Ophthalmology

## 2023-08-26 ENCOUNTER — Other Ambulatory Visit: Payer: Self-pay

## 2023-08-26 DIAGNOSIS — Z9842 Cataract extraction status, left eye: Secondary | ICD-10-CM | POA: Diagnosis not present

## 2023-08-26 DIAGNOSIS — I10 Essential (primary) hypertension: Secondary | ICD-10-CM | POA: Diagnosis not present

## 2023-08-26 DIAGNOSIS — G4730 Sleep apnea, unspecified: Secondary | ICD-10-CM | POA: Insufficient documentation

## 2023-08-26 DIAGNOSIS — Z79899 Other long term (current) drug therapy: Secondary | ICD-10-CM | POA: Insufficient documentation

## 2023-08-26 DIAGNOSIS — Z7951 Long term (current) use of inhaled steroids: Secondary | ICD-10-CM | POA: Insufficient documentation

## 2023-08-26 DIAGNOSIS — R519 Headache, unspecified: Secondary | ICD-10-CM | POA: Insufficient documentation

## 2023-08-26 DIAGNOSIS — K219 Gastro-esophageal reflux disease without esophagitis: Secondary | ICD-10-CM | POA: Insufficient documentation

## 2023-08-26 DIAGNOSIS — K449 Diaphragmatic hernia without obstruction or gangrene: Secondary | ICD-10-CM | POA: Insufficient documentation

## 2023-08-26 DIAGNOSIS — Z961 Presence of intraocular lens: Secondary | ICD-10-CM | POA: Diagnosis not present

## 2023-08-26 DIAGNOSIS — F32A Depression, unspecified: Secondary | ICD-10-CM | POA: Diagnosis not present

## 2023-08-26 DIAGNOSIS — H2511 Age-related nuclear cataract, right eye: Secondary | ICD-10-CM | POA: Insufficient documentation

## 2023-08-26 HISTORY — PX: CATARACT EXTRACTION W/PHACO: SHX586

## 2023-08-26 SURGERY — PHACOEMULSIFICATION, CATARACT, WITH IOL INSERTION
Anesthesia: Monitor Anesthesia Care | Site: Eye | Laterality: Right

## 2023-08-26 MED ORDER — TETRACAINE HCL 0.5 % OP SOLN
1.0000 [drp] | OPHTHALMIC | Status: DC | PRN
Start: 2023-08-26 — End: 2023-08-26
  Administered 2023-08-26 (×3): 1 [drp] via OPHTHALMIC

## 2023-08-26 MED ORDER — BRIMONIDINE TARTRATE-TIMOLOL 0.2-0.5 % OP SOLN
OPHTHALMIC | Status: DC | PRN
  Administered 2023-08-26: 1 [drp] via OPHTHALMIC

## 2023-08-26 MED ORDER — ARMC OPHTHALMIC DILATING DROPS
OPHTHALMIC | Status: AC
Start: 2023-08-26 — End: ?
  Filled 2023-08-26: qty 0.5

## 2023-08-26 MED ORDER — FENTANYL CITRATE (PF) 100 MCG/2ML IJ SOLN
INTRAMUSCULAR | Status: AC
Start: 2023-08-26 — End: ?
  Filled 2023-08-26: qty 2

## 2023-08-26 MED ORDER — ARMC OPHTHALMIC DILATING DROPS
1.0000 | OPHTHALMIC | Status: DC | PRN
  Administered 2023-08-26 (×2): 1 via OPHTHALMIC

## 2023-08-26 MED ORDER — LIDOCAINE HCL (PF) 2 % IJ SOLN
INTRAOCULAR | Status: DC | PRN
  Administered 2023-08-26: 2 mL

## 2023-08-26 MED ORDER — MIDAZOLAM HCL 2 MG/2ML IJ SOLN
INTRAMUSCULAR | Status: AC
  Filled 2023-08-26: qty 2

## 2023-08-26 MED ORDER — TETRACAINE HCL 0.5 % OP SOLN
OPHTHALMIC | Status: AC
  Filled 2023-08-26: qty 4

## 2023-08-26 MED ORDER — MOXIFLOXACIN HCL 0.5 % OP SOLN
OPHTHALMIC | Status: DC | PRN
Start: 2023-08-26 — End: 2023-08-26
  Administered 2023-08-26: .2 mL via OPHTHALMIC

## 2023-08-26 MED ORDER — FENTANYL CITRATE (PF) 100 MCG/2ML IJ SOLN
INTRAMUSCULAR | Status: DC | PRN
  Administered 2023-08-26: 50 ug via INTRAVENOUS

## 2023-08-26 MED ORDER — SIGHTPATH DOSE#1 BSS IO SOLN
INTRAOCULAR | Status: DC | PRN
  Administered 2023-08-26: 46 mL via OPHTHALMIC

## 2023-08-26 MED ORDER — MIDAZOLAM HCL 2 MG/2ML IJ SOLN
INTRAMUSCULAR | Status: DC | PRN
Start: 2023-08-26 — End: 2023-08-26
  Administered 2023-08-26: 1 mg via INTRAVENOUS

## 2023-08-26 MED ORDER — SIGHTPATH DOSE#1 NA CHONDROIT SULF-NA HYALURON 40-17 MG/ML IO SOLN
INTRAOCULAR | Status: DC | PRN
Start: 2023-08-26 — End: 2023-08-26
  Administered 2023-08-26: 1 mL via INTRAOCULAR

## 2023-08-26 MED ORDER — SIGHTPATH DOSE#1 BSS IO SOLN
INTRAOCULAR | Status: DC | PRN
Start: 2023-08-26 — End: 2023-08-26
  Administered 2023-08-26: 15 mL via INTRAOCULAR

## 2023-08-26 MED ORDER — LACTATED RINGERS IV SOLN: INTRAVENOUS | Status: DC

## 2023-08-26 SURGICAL SUPPLY — 12 items
CATARACT SUITE SIGHTPATH (MISCELLANEOUS) ×1 IMPLANT
CYSTOTOME ANGL RVRS SHRT 25G (CUTTER) ×1 IMPLANT
CYSTOTOME ANGL RVRS SHRT 25GA (CUTTER) ×1 IMPLANT
FEE CATARACT SUITE SIGHTPATH (MISCELLANEOUS) ×1 IMPLANT
GLOVE BIOGEL PI IND STRL 8 (GLOVE) ×1 IMPLANT
GLOVE SURG LX STRL 8.0 MICRO (GLOVE) ×1 IMPLANT
GLOVE SURG PROTEXIS BL SZ6.5 (GLOVE) ×1 IMPLANT
GLOVE SURG SYN 6.5 PF PI BL (GLOVE) ×1 IMPLANT
LENS IOL CLRN VT TRC 3 22.0 IMPLANT
NDL FILTER BLUNT 18X1 1/2 (NEEDLE) ×1 IMPLANT
NEEDLE FILTER BLUNT 18X1 1/2 (NEEDLE) ×1 IMPLANT
SYR 3ML LL SCALE MARK (SYRINGE) ×1 IMPLANT

## 2023-08-26 NOTE — H&P (Signed)
 [facility]   Primary Care Physician:  Sun, Vyvyan, MD Ophthalmologist: Dr. Dyke Glasser  Pre-Procedure History & Physical: HPI:  Monique Thompson is a 78 y.o. female here for cataract surgery.   Past Medical History:  Diagnosis Date   GERD (gastroesophageal reflux disease)    History of hiatal hernia    Hypertension    Migraine headache    In past, none recently   Sleep apnea    CPAP   Vertigo    rare    Past Surgical History:  Procedure Laterality Date   ABDOMINAL HYSTERECTOMY     BREAST EXCISIONAL BIOPSY Left    BREAST EXCISIONAL BIOPSY Left    BREAST EXCISIONAL BIOPSY Right    CATARACT EXTRACTION EXTRACAPSULAR Left 06/17/2023   Procedure: EXTRACTION, CATARACT, WITH IOL INSERTION 13.14 01:09.9;  Surgeon: Clair Crews, MD;  Location: [facility];  Service: Ophthalmology;  Laterality: Left;    Prior to Admission medications   Medication Sig Start Date End Date Taking? Authorizing Provider  albuterol  (VENTOLIN  HFA) 108 (90 Base) MCG/ACT inhaler Inhale 2 puffs into the lungs every 4 (four) hours as needed for wheezing or shortness of breath. 03/18/22  Yes Loman Risk, MD  alendronate (FOSAMAX) 70 MG tablet Take 70 mg by mouth once a week. Take with a full glass of water on an empty stomach.   Yes [provider]  amLODipine (NORVASC) 5 MG tablet  11/25/12  Yes [provider]  atorvastatin (LIPITOR) 40 MG tablet  02/07/20  Yes [provider]  Cholecalciferol (VITAMIN D-3) 125 MCG (5000 UT) TABS Take by mouth.   Yes [provider]  clobetasol  (TEMOVATE ) 0.05 % external solution Apply to scalp a few hours prior to shampooing. Can also use as a spot treatment as needed between washes. 06/11/23  Yes Dellar Fenton, DO  FLUoxetine (PROZAC) 40 MG capsule Take 40 mg by mouth daily.   Yes [provider]  HYDROcodone  bit-homatropine (HYCODAN) 5-1.5 MG/5ML syrup Take 5 mLs by mouth every 6 (six) hours as needed for cough.  03/18/22  Yes Loman Risk, MD  ibuprofen  (ADVIL ) 600 MG tablet Take 1 tablet (600 mg total) by mouth every 6 (six) hours as needed for fever or moderate pain. 03/18/22  Yes Loman Risk, MD  latanoprost (XALATAN) 0.005 % ophthalmic solution 1 drop at bedtime.   Yes [provider]  lisinopril (PRINIVIL,ZESTRIL) 10 MG tablet Take by mouth.   Yes [provider]  LORazepam (ATIVAN) 0.5 MG tablet Take 0.5 mg by mouth every 8 (eight) hours.   Yes [provider]  Multiple Minerals-Vitamins (CITRACAL PLUS BONE DENSITY PO) Take by mouth.   Yes [provider]  Multiple Vitamin (MULTIVITAMIN) tablet Take 1 tablet by mouth daily.   Yes [provider]  Safety Seal Miscellaneous MISC Brogaine with minoxidil usp 7% and finasteride usp 0.1% - use every morning on affected areas of the scalp applying with a q-tip or cotton ball daily. 08/13/23  Yes Dellar Fenton, DO    Allergies as of 07/21/2023 - Review Complete 06/17/2023  Allergen Reaction Noted   Tape  08/29/2021   Latex Hives and Rash 04/07/2014   Other Rash 09/11/2021    Family History  Problem Relation Age of Onset   Breast cancer Neg Hx    BRCA 1/2 Neg Hx     Social History   Socioeconomic History   Marital status: Married    Spouse name: Not on file   Number  of children: Not on file   Years of education: Not on file   Highest education level: Not on file  Occupational History   Not on file  Tobacco Use   Smoking status: Never   Smokeless tobacco: Never  Vaping Use   Vaping status: Never Used  Substance and Sexual Activity   Alcohol use: Yes    Comment: Rare   Drug use: Never   Sexual activity: Not on file  Other Topics Concern   Not on file  Social History Narrative   Not on file   Social Drivers of Health   Financial Resource Strain: Not on file  Food Insecurity: Not on file  Transportation Needs: Not on file  Physical Activity: Not on file  Stress: Not on file   Social Connections: Not on file  Intimate Partner Violence: Not on file    Review of Systems: See HPI, otherwise negative ROS  Physical Exam: BP 136/77   Pulse 76   Temp 98.1 F (36.7 C) (Temporal)   Resp (!) 76   Ht 5\' 3"  (1.6 m)   Wt 65.8 kg   SpO2 97%   BMI 25.69 kg/m  General:   Alert, cooperative. Head:  Normocephalic and atraumatic. Respiratory:  Normal work of breathing. Cardiovascular:  NAD  Impression/Plan: Monique Thompson is here for cataract surgery.  Risks, benefits, limitations, and alternatives regarding cataract surgery have been reviewed with the patient.  Questions have been answered.  All parties agreeable.   Clair Crews, MD  08/26/2023, 11:15 AM

## 2023-08-26 NOTE — Op Note (Signed)
 PREOPERATIVE DIAGNOSIS:  Nuclear sclerotic cataract of the right eye.   POSTOPERATIVE DIAGNOSIS:  Nuclear sclerotic cataract of the right eye.   OPERATIVE PROCEDURE: Procedure(s): PHACOEMULSIFICATION, CATARACT, WITH IOL INSERTION 10.13 00:56.5   SURGEON:  Clair Crews, MD.   ANESTHESIA: 1.      Managed anesthesia care. 2.     0.25ml of Shugarcaine was instilled following the paracentesis  Anesthesiologist: Emilie Harden, MD CRNA: Sherrlyn Dolores, CRNA; Wilhelmena Hanson, CRNA  COMPLICATIONS:  None.   TECHNIQUE:   Stop and chop    DESCRIPTION OF PROCEDURE:  The patient was examined and consented in the preoperative holding area where the aforementioned topical anesthesia was applied to the right eye.  The patient was brought back to the Operating Room where he was sat upright on the gurney and given a target to fixate upon while the eye was marked at the 3:00 and 9:00 position.  The patient was then reclined on the operating table.  The eye was prepped and draped in the usual sterile ophthalmic fashion and a lid speculum was placed. A paracentesis was created with the side port blade and the anterior chamber was filled with viscoelastic. A near clear corneal incision was performed with the steel keratome. A continuous curvilinear capsulorrhexis was performed with a cystotome followed by the capsulorrhexis forceps. Hydrodissection and hydrodelineation were carried out with BSS on a blunt cannula. The lens was removed in a stop and chop technique and the remaining cortical material was removed with the irrigation-aspiration handpiece. The eye was inflated with viscoelastic and the ZCT  lens  was placed in the eye and rotated to within a few degrees of the predetermined orientation.  The remaining viscoelastic was removed from the eye.  The Sinskey hook was used to rotate the toric lens into its final resting place at 035 degrees.  0. The eye was inflated to a physiologic pressure and found to be  watertight. 0.22ml of Vigamox  was placed in the anterior chamber.  The eye was dressed with Combigan . The patient was given protective glasses to wear throughout the day and a shield with which to sleep tonight. The patient was also given drops with which to begin a drop regimen today and will follow-up with me in one day. Implant Name Type Inv. Item Serial No. Manufacturer Lot No. LRB No. Used Action  LENS IOL CLRN VT TRC 3 22.0 - [id number]  LENS IOL CLRN VT TRC 3 22.0 [id number] [employer]  Right 1 Implanted   Procedure(s): PHACOEMULSIFICATION, CATARACT, WITH IOL INSERTION 10.13 00:56.5 (Right)  Electronically signed: Clair Crews 08/26/2023 11:42 AM

## 2023-08-26 NOTE — Transfer of Care (Signed)
 Immediate Anesthesia Transfer of Care Note  Patient: RAMESHA POSTER  Procedure(s) Performed: PHACOEMULSIFICATION, CATARACT, WITH IOL INSERTION 10.13 00:56.5 (Right: Eye)  Patient Location: PACU  Anesthesia Type: MAC  Level of Consciousness: awake, alert  and patient cooperative  Airway and Oxygen Therapy: Patient Spontanous Breathing and Patient connected to supplemental oxygen  Post-op Assessment: Post-op Vital signs reviewed, Patient's Cardiovascular Status Stable, Respiratory Function Stable, Patent Airway and No signs of Nausea or vomiting  Post-op Vital Signs: Reviewed and stable  Complications: No notable events documented.

## 2023-08-26 NOTE — Anesthesia Postprocedure Evaluation (Signed)
 Anesthesia Post Note  Patient: Monique Thompson  Procedure(s) Performed: PHACOEMULSIFICATION, CATARACT, WITH IOL INSERTION 10.13 00:56.5 (Right: Eye)  Patient location during evaluation: PACU Anesthesia Type: MAC Level of consciousness: awake and alert Pain management: pain level controlled Vital Signs Assessment: post-procedure vital signs reviewed and stable Respiratory status: spontaneous breathing, nonlabored ventilation, respiratory function stable and patient connected to nasal cannula oxygen Cardiovascular status: stable and blood pressure returned to baseline Postop Assessment: no apparent nausea or vomiting Anesthetic complications: no   No notable events documented.   Last Vitals:  Vitals:   08/26/23 1143 08/26/23 1148  BP: (!) 145/87 133/87  Pulse: 68 68  Resp: (!) 7 13  Temp: (!) 36.3 C (!) 36.3 C  SpO2: 97% 96%    Last Pain:  Vitals:   08/26/23 1148  TempSrc:   PainSc: 0-No pain                 Marina Desire C Shakiya Mcneary

## 2023-08-27 DIAGNOSIS — R350 Frequency of micturition: Secondary | ICD-10-CM | POA: Diagnosis not present

## 2023-08-27 DIAGNOSIS — N3946 Mixed incontinence: Secondary | ICD-10-CM | POA: Diagnosis not present

## 2023-08-27 DIAGNOSIS — R351 Nocturia: Secondary | ICD-10-CM | POA: Diagnosis not present

## 2023-09-09 DIAGNOSIS — G4733 Obstructive sleep apnea (adult) (pediatric): Secondary | ICD-10-CM | POA: Diagnosis not present

## 2023-10-03 DIAGNOSIS — F411 Generalized anxiety disorder: Secondary | ICD-10-CM | POA: Diagnosis not present

## 2023-10-03 DIAGNOSIS — I10 Essential (primary) hypertension: Secondary | ICD-10-CM | POA: Diagnosis not present

## 2023-10-03 DIAGNOSIS — E785 Hyperlipidemia, unspecified: Secondary | ICD-10-CM | POA: Diagnosis not present

## 2023-10-03 DIAGNOSIS — G3184 Mild cognitive impairment, so stated: Secondary | ICD-10-CM | POA: Diagnosis not present

## 2023-10-09 DIAGNOSIS — G4733 Obstructive sleep apnea (adult) (pediatric): Secondary | ICD-10-CM | POA: Diagnosis not present

## 2023-12-04 DIAGNOSIS — N3946 Mixed incontinence: Secondary | ICD-10-CM | POA: Diagnosis not present

## 2023-12-04 DIAGNOSIS — R351 Nocturia: Secondary | ICD-10-CM | POA: Diagnosis not present

## 2024-01-20 DIAGNOSIS — S76211A Strain of adductor muscle, fascia and tendon of right thigh, initial encounter: Secondary | ICD-10-CM | POA: Diagnosis not present

## 2024-01-20 DIAGNOSIS — Z23 Encounter for immunization: Secondary | ICD-10-CM | POA: Diagnosis not present

## 2024-01-20 DIAGNOSIS — M25531 Pain in right wrist: Secondary | ICD-10-CM | POA: Diagnosis not present

## 2024-01-20 DIAGNOSIS — G3184 Mild cognitive impairment, so stated: Secondary | ICD-10-CM | POA: Diagnosis not present

## 2024-01-28 DIAGNOSIS — R2231 Localized swelling, mass and lump, right upper limb: Secondary | ICD-10-CM | POA: Diagnosis not present

## 2024-01-28 DIAGNOSIS — M25531 Pain in right wrist: Secondary | ICD-10-CM | POA: Diagnosis not present

## 2024-02-05 ENCOUNTER — Other Ambulatory Visit: Payer: Self-pay | Admitting: Family Medicine

## 2024-02-05 DIAGNOSIS — Z1231 Encounter for screening mammogram for malignant neoplasm of breast: Secondary | ICD-10-CM

## 2024-02-12 ENCOUNTER — Emergency Department

## 2024-02-12 ENCOUNTER — Emergency Department: Admission: EM | Admit: 2024-02-12 | Discharge: 2024-02-12 | Disposition: A | Discharge status: Home / Self Care

## 2024-02-12 ENCOUNTER — Other Ambulatory Visit: Payer: Self-pay

## 2024-02-12 DIAGNOSIS — M25531 Pain in right wrist: Secondary | ICD-10-CM | POA: Insufficient documentation

## 2024-02-12 MED ORDER — ACETAMINOPHEN 325 MG PO TABS
650.0000 mg | ORAL_TABLET | Freq: Once | ORAL | Status: AC
  Administered 2024-02-12: 650 mg via ORAL
  Filled 2024-02-12: qty 2

## 2024-02-12 NOTE — ED Notes (Signed)
 See triage note  Presents with right wrist pain  States injury was about 2 months ago Has been seen for same  but conts to have pain No deformity noted  Good pulses

## 2024-02-12 NOTE — Discharge Instructions (Addendum)
 Please follow-up with hand surgery.  Please wear the wrist brace provided.  Rest, ice, elevate your hand.  Please return for any new, worsening, or changing symptoms or other concerns.  It was a pleasure caring for you today.

## 2024-02-12 NOTE — ED Provider Notes (Signed)
 [facility] Provider Note    Event Date/Time   First MD Initiated Contact with Patient 02/12/24 1021     (approximate)   History   Wrist Injury   HPI  Monique Thompson is a 78 y.o. female who presents today for evaluation of wrist pain for the past 2 months.  She reports that she had a trip and fall at the initial injury 2 months ago, but did not seek care until approximately 2 weeks ago.  Reports that she was seen at an urgent care 2 weeks ago and was given a wrist brace.  She has been taking prednisone  and ibuprofen .  She reports that today she was opening up a can and it felt worse like something snapped.  She reports that the pain is at the base of her thumb.  No numbness or tingling or weakness.  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   HYPERLIPIDEMIA 09/07/2007   DEPRESSION 09/07/2007   Essential hypertension 09/07/2007   Allergic rhinitis 09/07/2007          Physical Exam   Triage Vital Signs: ED Triage Vitals  Encounter Vitals Group     BP 02/12/24 1018 136/81     Girls Systolic BP Percentile --      Girls Diastolic BP Percentile --      Boys Systolic BP Percentile --      Boys Diastolic BP Percentile --      Pulse Rate 02/12/24 1018 83     Resp 02/12/24 1018 16     Temp 02/12/24 1018 98 F (36.7 C)     Temp Source 02/12/24 1018 Oral     SpO2 02/12/24 1018 100 %     Weight 02/12/24 1019 148 lb (67.1 kg)     Height 02/12/24 1019 5' 3 (1.6 m)     Head Circumference --      Peak Flow --      Pain Score 02/12/24 1019 5     Pain Loc --      Pain Education --      Exclude from Growth Chart --     Most recent vital signs: Vitals:   02/12/24 1018  BP: 136/81  Pulse: 83  Resp: 16  Temp: 98 F (36.7 C)  SpO2: 100%    Physical Exam Vitals and nursing note reviewed.  Constitutional:      General: Awake and alert. No acute distress.    Appearance: Normal appearance. The patient is  normal weight.  HENT:     Head: Normocephalic and atraumatic.     Mouth: Mucous membranes are moist.  Eyes:     General: PERRL. Normal EOMs        Right eye: No discharge.        Left eye: No discharge.     Conjunctiva/sclera: Conjunctivae normal.  Cardiovascular:     Rate and Rhythm: Normal rate.     Pulses: Normal pulses.  Pulmonary:     Effort: Pulmonary effort is normal. No respiratory distress.     Breath sounds: Normal breath sounds.  Abdominal:     Abdomen is soft. Musculoskeletal:        General: No swelling. Normal range of motion.     Cervical back: Normal range of motion and neck supple.  Right wrist: No snuffbox tenderness.  No swelling, ecchymosis, or erythema noted.  Normal abduction and adduction of thumb against resistance.  Normal grip  strength.  Normal intrinsic muscle function.  No open wounds.  Positive Finkelstein test. Skin:    General: Skin is warm and dry.     Capillary Refill: Capillary refill takes less than 2 seconds.     Findings: No rash.  Neurological:     Mental Status: The patient is awake and alert.      ED Results / Procedures / Treatments   Labs (all labs ordered are listed, but only abnormal results are displayed) Labs Reviewed - No data to display   EKG     RADIOLOGY I independently reviewed and interpreted imaging and agree with radiologists findings.     PROCEDURES:  Critical Care performed:   Procedures   MEDICATIONS ORDERED IN ED: Medications  acetaminophen  (TYLENOL ) tablet 650 mg (650 mg Oral Given 02/12/24 1050)     IMPRESSION / MDM / ASSESSMENT AND PLAN / ED COURSE  I reviewed the triage vital signs and the nursing notes.   Differential diagnosis includes, but is not limited to, wrist sprain, fracture, dequervains tenosynovitis.  Patient is awake and alert, hemodynamically stable and afebrile.  She is nontoxic in appearance.  I reviewed the patient's chart.  I am unable to see her previous x-ray from  [place].  Further workup is indicated.  Repeat x-ray obtained given that she has been taking prednisone , which can soften her bones.  X-ray reveals indeterminate crescent shaped osseous fragment in the volar soft tissues of the wrist which is likely contributing to her symptoms.  I also suspect that she has a component of possible dequerveins tenosynovitis given her positive Finklestein's test and pain with opening jar.  She was placed in a thumb spica splint and instructed to follow-up with hand surgery.  The appropriate follow-up information was provided.  We discussed rest, ice, elevation as well.  We also discussed return precautions.  Patient understands and agrees with plan.  She was discharged in stable condition.   Patient's presentation is most consistent with acute complicated illness / injury requiring diagnostic workup.    FINAL CLINICAL IMPRESSION(S) / ED DIAGNOSES   Final diagnoses:  Right wrist pain     Rx / DC Orders   ED Discharge Orders     None        Note:  This document was prepared using Dragon voice recognition software and may include unintentional dictation errors.   Amanuel Sinkfield E, PA-C 02/12/24 1300    Nicholaus Rolland BRAVO, MD 02/12/24 1524

## 2024-02-12 NOTE — ED Triage Notes (Signed)
 Pt had wrist injury about 2 months ago and it has continued to give her pain. Was seen at [facility] in [place] about 2 weeks ago and has wrist brace on currently. Has been taking prednisone  and motrin . States today was opening up a can and it felt suddenly worse like something snapped.

## 2024-03-02 ENCOUNTER — Other Ambulatory Visit: Payer: Self-pay | Admitting: Family Medicine

## 2024-03-02 DIAGNOSIS — N6310 Unspecified lump in the right breast, unspecified quadrant: Secondary | ICD-10-CM

## 2024-03-02 DIAGNOSIS — N6320 Unspecified lump in the left breast, unspecified quadrant: Secondary | ICD-10-CM

## 2024-03-03 ENCOUNTER — Other Ambulatory Visit: Payer: Self-pay | Admitting: Family Medicine

## 2024-03-03 DIAGNOSIS — N63 Unspecified lump in unspecified breast: Secondary | ICD-10-CM

## 2024-03-17 ENCOUNTER — Other Ambulatory Visit: Payer: Self-pay | Admitting: Family Medicine

## 2024-03-17 ENCOUNTER — Ambulatory Visit
Admission: RE | Admit: 2024-03-17 | Discharge: 2024-03-17 | Disposition: A | Discharge status: Home / Self Care | Source: Ambulatory Visit | Attending: Family Medicine | Admitting: Family Medicine

## 2024-03-17 DIAGNOSIS — N63 Unspecified lump in unspecified breast: Secondary | ICD-10-CM

## 2024-03-17 DIAGNOSIS — R599 Enlarged lymph nodes, unspecified: Secondary | ICD-10-CM

## 2024-03-23 ENCOUNTER — Ambulatory Visit
Admission: RE | Admit: 2024-03-23 | Discharge: 2024-03-23 | Disposition: A | Discharge status: Home / Self Care | Source: Ambulatory Visit | Attending: Family Medicine | Admitting: Family Medicine

## 2024-03-23 ENCOUNTER — Inpatient Hospital Stay: Admission: RE | Admit: 2024-03-23 | Discharge: 2024-03-23 | Attending: Family Medicine | Admitting: Family Medicine

## 2024-03-23 DIAGNOSIS — N63 Unspecified lump in unspecified breast: Secondary | ICD-10-CM

## 2024-03-23 DIAGNOSIS — R599 Enlarged lymph nodes, unspecified: Secondary | ICD-10-CM

## 2024-03-23 HISTORY — PX: BREAST BIOPSY: SHX20

## 2024-03-24 LAB — SURGICAL PATHOLOGY
